# Patient Record
Sex: Male | Born: 1965 | Race: White | Hispanic: No | Marital: Single | Smoking: Never smoker
Health system: Southern US, Community
[De-identification: ages and names within clinical notes are randomized; demographics above are authoritative.]

## PROBLEM LIST (undated history)

## (undated) DIAGNOSIS — G4730 Sleep apnea, unspecified: Secondary | ICD-10-CM

## (undated) DIAGNOSIS — G709 Myoneural disorder, unspecified: Secondary | ICD-10-CM

## (undated) HISTORY — PX: TRIGGER FINGER RELEASE: SHX641

## (undated) HISTORY — PX: KNEE ARTHROSCOPY: SHX127

---
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
## 1997-04-10 HISTORY — PX: BACK SURGERY: SHX140

## 2000-04-10 HISTORY — PX: ROTATOR CUFF REPAIR: SHX139

## 2000-04-10 HISTORY — PX: ELBOW DEBRIDEMENT: SHX931

## 2002-10-28 ENCOUNTER — Ambulatory Visit (HOSPITAL_BASED_OUTPATIENT_CLINIC_OR_DEPARTMENT_OTHER): Admission: RE | Admit: 2002-10-28 | Discharge: 2002-10-28 | Payer: Self-pay | Admitting: Orthopaedic Surgery

## 2005-05-30 ENCOUNTER — Ambulatory Visit (HOSPITAL_BASED_OUTPATIENT_CLINIC_OR_DEPARTMENT_OTHER): Admission: RE | Admit: 2005-05-30 | Discharge: 2005-05-30 | Payer: Self-pay | Admitting: Orthopaedic Surgery

## 2006-04-10 HISTORY — PX: ROTATOR CUFF REPAIR: SHX139

## 2006-06-15 ENCOUNTER — Ambulatory Visit (HOSPITAL_BASED_OUTPATIENT_CLINIC_OR_DEPARTMENT_OTHER): Admission: RE | Admit: 2006-06-15 | Discharge: 2006-06-15 | Payer: Self-pay | Admitting: Orthopedic Surgery

## 2006-12-14 DIAGNOSIS — R079 Chest pain, unspecified: Secondary | ICD-10-CM | POA: Insufficient documentation

## 2009-05-24 ENCOUNTER — Encounter: Admission: RE | Admit: 2009-05-24 | Discharge: 2009-05-24 | Payer: Self-pay | Admitting: Orthopaedic Surgery

## 2010-08-26 NOTE — Op Note (Signed)
Victor [name], Victor [name]                ACCOUNT NO.:  [id number]   MEDICAL RECORD NO.:  [id number]          PATIENT TYPE:  AMB   LOCATION:  DSC                          FACILITY:  [facility]   PHYSICIAN:  Lubertha Basque. Dalldorf, M.D.DATE OF BIRTH:  [birth year]   DATE OF PROCEDURE:  05/30/2005  DATE OF DISCHARGE:                                 OPERATIVE REPORT   PREOP DIAGNOSES:  1.  Right knee chondromalacia patella.  2.  Left knee chondromalacia patella.   POSTOPERATIVE DIAGNOSES:  1.  Right knee chondromalacia patella.  2.  Left knee chondromalacia patella.   PROCEDURE:  1.  Right knee chondroplasty patellofemoral joint.  2.  Left knee chondroplasty patellofemoral joint.   ANESTHESIA:  General.   ATTENDING SURGEON:  Dalldorf.   ASSISTANT:  Carnaghi, PA.   INDICATIONS FOR PROCEDURE:  The patient is a 45 year old man with a very  long history of bilateral knee pain. He has persisted with crepitation and  effusion and pain despite aspiration and injection and oral anti-  inflammatories as well as a stretching program. He has undergone MRI scan of  one knee which shows things confined to the patellofemoral joint as causing  his trouble.  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  He is offered operative intervention to consist of bilateral arthroscopies.  Informed operative consent was obtained discussion of possible complications  of reaction to anesthesia and infection.   DESCRIPTION OF PROCEDURE:  Patient taken to the operating suite where  general anesthetic was applied without difficulty. He was positioned supine  and prepped and draped in normal sterile fashion.  After administration of  preop IV Kefzol, an arthroscopy the right knee was performed through two  inferior portals. Suprapatellar pouch was benign while the patellofemoral  joint exhibited grade 3 and 4 breakdown in the intertrochlear  groove.  Patella actually tracked fairly well. Thorough chondroplasty was done back  to stable tissues. There was minimal exposed bone and no drilling was felt  to be indicated. Also as the patella tracked in a fairly good position, we  elected not to add any lateral release. The medial and lateral compartments  exhibited no evidence of meniscal or articular cartilage injury. The knee  was thoroughly irrigated followed by placement of Marcaine with epinephrine  and morphine. Adaptic was applied followed by dry gauze and loose Ace wrap.  We then turned our tension to the left knee. An identical procedure was  performed there through two inferior portals. Again he had grade 3 and four  patellofemoral breakdown in the intertrochlear groove which was addressed  with thorough chondroplasty back to stable tissues. This side actually  looked a bit worse than the opposite side. Medial and lateral compartments  were again benign in and both knees he had intact ACL and PCL structures.  The left knee was irrigated at the end of the case followed by placement  Marcaine with epinephrine and morphine. Adaptic was placed over the portals  here followed by dry gauze and  loose Ace wrap. Estimated blood loss and  intraoperative fluids can be obtained from anesthesia records.   DISPOSITION:  The patient was extubated in the operating room and taken to  recovery room in stable addition. Plans were for him to go home the same day  and follow up in the office in less than a week. I will contact him by phone  tonight.      Lubertha Basque Jerl Santos, M.D.  Electronically Signed     PGD/MEDQ  D:  05/30/2005  T:  05/31/2005  Job:  [id number]

## 2010-08-26 NOTE — Op Note (Signed)
NAMEVIJAY, [name]                ACCOUNT NO.:  [id number]   MEDICAL RECORD NO.:  [id number]          PATIENT TYPE:  AMB   LOCATION:  DSC                          FACILITY:  [facility]   PHYSICIAN:  Harvie Junior, M.D.   DATE OF BIRTH:  04-30-1965   DATE OF PROCEDURE:  06/15/2006  DATE OF DISCHARGE:                               OPERATIVE REPORT   PREOPERATIVE DIAGNOSES:  Impingement with acromioclavicular joint  arthritis.   POSTOPERATIVE DIAGNOSES:  1. Impingement.  2. Acromioclavicular joint arthritis.  3. Superior labral tear.   PRINCIPLE PROCEDURES:  1. Anterolateral acromioplasty from the lateral and posterior      compartment.  2. Distal clavicle resection through an anterior compartment.  3. Debridement of anterior superior labral tear.   SURGEON:  Harvie Junior, M.D.   ASSISTANT:  Marshia Ly, P.A.   ANESTHESIA:  General.   BRIEF HISTORY:  Mr. Victor [name] is a 45 year old male with a long history of  having bilateral shoulder pain.  His right shoulder had been treated  several years ago, and he had done well with an acromioplasty.  He came  in complaining of pain, and he ultimately was evaluated by Dr. Jerl Santos,  who felt that he needed acromioplasty and distal clavicle resection.  He  needed to get this done with relative time urgency, and because of Dr.  Nolon Nations inability to get the surgery done in a timely fashion, he was  evaluated by [place] and taken to the operating room for this procedure.  The  patient had excellent strength preoperatively and I did not feel that he  had a rotator cuff tear, and I felt that MRI was not of much use.  He  had not had injections preoperatively, but had had the other side done  and had done well with the surgery and did not really want to go through  with injection therapy, and I thought that was not unreasonable.  He was  brought to the operating room for this procedure.   PROCEDURES:  The patient was brought to the operating room,  and after  adequate anesthesia was obtained with a general anesthetic, the patient  was placed supine on the operating table.  The left shoulder was prepped  and draped in the usual sterile fashion.  Following this, routine  arthroscopic examination of the shoulder revealed that there was an  obvious anterior superior labral tear.  The biceps tendon was well  attached, the rotator cuff undersurface was pristine.  Following this,  attention was turned out of the glenohumeral joint into the subacromial  space, and an anterolateral acromioplasty was performed from the lateral  and posterior compartment.  About 15 mm of acromion was removed and this  was smoothed down.  Attention was turned anteriorly, where a distal  clavicle resection was undertaken of about 17 mm from the anterior  compartment.  At this point, a subtotal bursectomy was performed with  the suction shaver, and the rotator cuff was evaluated thoroughly from  the superior surface.  There was no evidence of full-thickness, or even  significant partial-thickness  tearing of the rotator cuff from the  superior surface.  At this point, the shoulder was thoroughly and copiously irrigated and  suctioned dry.  The arthroscopic portals were closed with a bandage.  A  sterile compressive dressing was applied, and the patient was taken to  the recovery room, where he was noted to be in satisfactory condition.  The estimated blood loss for the procedure was negligible.      Harvie Junior, M.D.  Electronically Signed     JLG/MEDQ  D:  06/15/2006  T:  06/15/2006  Job:  [id number]

## 2010-08-26 NOTE — Op Note (Signed)
NAMENNAEMEKA, SAMSON                            ACCOUNT NO.:  [id number]   MEDICAL RECORD NO.:  [id number]                   PATIENT TYPE:  AMB   LOCATION:  DSC                                  FACILITY:  [facility]   PHYSICIAN:  Lubertha Basque. Jerl Santos, M.D.             DATE OF BIRTH:  [birth year]   DATE OF PROCEDURE:  10/28/2002  DATE OF DISCHARGE:                                 OPERATIVE REPORT   PREOPERATIVE DIAGNOSES:  1. Right shoulder impingement.  2. Right shoulder partial rotator cuff tear.  3. Right elbow lateral epicondylitis.   POSTOPERATIVE DIAGNOSES:  1. Right shoulder impingement.  2. Right shoulder partial rotator cuff tear.  3. Right elbow lateral epicondylitis.   OPERATION:  1. Right shoulder arthroscopic acromioplasty.  2. Right shoulder arthroscopic partial claviculectomy.  3. Right shoulder arthroscopic debridement partial rotator cuff tear.  4. Right elbow lateral epicondylar release.   ANESTHESIA:  General   SURGEON:  Lubertha Basque. Jerl Santos, M.D.   ASSISTANT:  Lindwood Qua, P.A.   INDICATIONS FOR PROCEDURE:  The patient is a 45 year old male with a many  year history of right dominant arm difficulty.  He is apparently status post  six or seven subacromial injections and about the same number of shots about  the outside aspect of his elbow.  He has an exam consistent with chronic  impingement and chronic lateral epicondylitis.  He has also failed an  exercise program and various oral anti-inflammatories and braces.  He has  been left with pain at rest and pain with activity.  He is offered operative  intervention at both sites.  Informed operative consent was obtained after  discussion of possible complications of, reaction to anesthesia and  infection.   DESCRIPTION OF PROCEDURE:  The patient was taken to the operating suite  where general anesthetic was applied without difficulty.  He was positioned  in beach chair position and prepped and draped in a normal  sterile fashion.  After the administration of IV antibiotic, an arthroscopy of the right  shoulder was performed through a total of two portals.  Glenohumeral joint  showed no degenerative change and the biceps and rotator cuff appear benign  from below.  In the subacromial space he had a great deal of bursitis,  addressed with a thorough bursectomy.  He had some partial thickness rotator  cuff tearing addressed with debridement, but no full thickness tear was  found.  He had a prominent subacromial morphology addressed with an  acromioplasty back to a flat surface.  This was done with the bur in the  lateral position followed by transfer of the bur to the posterior position.  He also appeared to have a prominence of the distal clavicle and partial  claviculectomy was done without a formal anterior cervical diskectomy  decompression.  This appeared to be compressed with cuff more thoroughly.  The shoulder was then irrigated followed  by reapproximation of the skin with  Nylon.  Attention was turned to the elbow.  A small lateral incision was  made with dissection down to the extensor origin.  This was split  longitudinally.  Some devitalized tissue was removed from the insertion site  on the lateral aspect of the humerus.  The wound was irrigated followed by  placement of single suture in the extensor mechanism.  Skin was  reapproximated with Nylon.  Marcaine was injected and Adaptic was applied to  the wound, followed by dry gauze dressing.  A similar dry gauze dressing was  placed on the shoulder.  Estimated blood loss and intraoperative fluids can  be obtained from anesthesia records.   DISPOSITION:  The patient was extubated in the operating room and taken to  the recovery room in stable condition.  Plans were for him to go home the  same day and follow up in the office in one week.  I will contact him by  phone tonight.                                               Lubertha Basque  Jerl Santos, M.D.    PGD/MEDQ  D:  10/28/2002  T:  10/28/2002  Job:  [id number]

## 2012-04-10 HISTORY — PX: CARPAL TUNNEL RELEASE: SHX101

## 2014-07-29 ENCOUNTER — Other Ambulatory Visit: Payer: Self-pay | Admitting: Orthopedic Surgery

## 2014-07-30 ENCOUNTER — Encounter (HOSPITAL_BASED_OUTPATIENT_CLINIC_OR_DEPARTMENT_OTHER): Payer: Self-pay | Admitting: *Deleted

## 2014-07-30 NOTE — H&P (Signed)
Victor FlightMichael Brown is an 49 y.o. male.   CC / Reason for Visit: Left long finger triggering HPI: This patient is a [age] she'll male who has had multiple hand procedures over the years, to include a left index finger trigger release and open carpal tunnel release in 2014, and more recently left long and ring finger trigger releases.  It is my understanding that on the operating room table, he was noted to have any triggering.  Within a few days following surgery he began to develop a little bit of catching that has become more pronounced since then in the long finger.  He is employed in heavy work such as Administrator, sportswelding, and has remained out of work since his surgery.  He reports that when he had surgery on the other hand for trigger releases in the more remote past, he returned to work within about 3 weeks.  Past Medical History  Diagnosis Date  . Sleep apnea     uses cpap    Past Surgical History  Procedure Laterality Date  . Back surgery  1999  . Rotator cuff repair Right 2002  . Rotator cuff repair Left 2008  . Carpal tunnel release N/A 2014  . Trigger finger release Bilateral 2001-2016    multiple  . Knee arthroplasty Bilateral 2005  . Elbow debridement Right 2002  . Mandible surgery N/A 1986    History reviewed. No pertinent family history. Social History:  reports that he has never smoked. He has never used smokeless tobacco. He reports that he does not drink alcohol or use illicit drugs.  Allergies: No Known Allergies  No prescriptions prior to admission    No results found for this or any previous visit (from the past 48 hour(s)). No results found.  Review of Systems  All other systems reviewed and are negative.   Height 6' (1.829 m), weight 131.543 kg (290 lb). Physical Exam  Constitutional:  WD, WN, NAD HEENT:  NCAT, EOMI Neuro/Psych:  Alert & oriented to person, place, and time; appropriate mood & affect Lymphatic: No generalized UE edema or lymphadenopathy Extremities / MSK:   Both UE are normal with respect to appearance, ranges of motion, joint stability, muscle strength/tone, sensation, & perfusion except as otherwise noted:  Left hand has incisions in the appropriate stage of healing to be about a month out.  They're little firm and lumpy and a little maroon.  He has tenderness at the base of the long finger just proximal to the proximal digital crease, and appears to have triggering at that site through each flexion cycle.  When it triggers, it is painful and worsened with thumb pressure applied to that site.  There is no triggering from the extensor apparatus.  Labs / Xrays:  No radiographic studies obtained today.  Assessment:  Persistent or recurrent left long finger triggering following release a month ago.  Plan: I discussed different options with him.  At this point, he can return to work until this is further resolved.  He would like to undergo exploration with additional release as indicated.  I think this is reasonable.  I indicated that the incision may wind up being longer and perhaps a 2 limb zigzag in this instance versus his original.  The details of the operative procedure were discussed with the patient.  Questions were invited and answered.  In addition to the goal of the procedure, the risks of the procedure to include but not limited to bleeding; infection; damage to the nerves or  blood vessels that could result in bleeding, numbness, weakness, chronic pain, and the need for additional procedures; stiffness; the need for revision surgery; and anesthetic risks, were reviewed.  No specific outcome was guaranteed or implied.  Informed consent was obtained.  Victor Brown A. 07/30/2014, 2:06 PM

## 2014-08-03 ENCOUNTER — Ambulatory Visit (HOSPITAL_BASED_OUTPATIENT_CLINIC_OR_DEPARTMENT_OTHER)
Admission: RE | Admit: 2014-08-03 | Discharge: 2014-08-03 | Disposition: A | Discharge status: Home / Self Care | Payer: Managed Care, Other (non HMO) | Source: Ambulatory Visit | Attending: Orthopedic Surgery | Admitting: Orthopedic Surgery

## 2014-08-03 ENCOUNTER — Ambulatory Visit (HOSPITAL_BASED_OUTPATIENT_CLINIC_OR_DEPARTMENT_OTHER): Payer: Managed Care, Other (non HMO) | Admitting: Certified Registered"

## 2014-08-03 ENCOUNTER — Encounter (HOSPITAL_BASED_OUTPATIENT_CLINIC_OR_DEPARTMENT_OTHER): Payer: Self-pay | Admitting: Certified Registered"

## 2014-08-03 ENCOUNTER — Encounter (HOSPITAL_BASED_OUTPATIENT_CLINIC_OR_DEPARTMENT_OTHER)
Admission: RE | Disposition: A | Discharge status: Home / Self Care | Payer: Self-pay | Source: Ambulatory Visit | Attending: Orthopedic Surgery

## 2014-08-03 DIAGNOSIS — Z96653 Presence of artificial knee joint, bilateral: Secondary | ICD-10-CM | POA: Insufficient documentation

## 2014-08-03 DIAGNOSIS — G4730 Sleep apnea, unspecified: Secondary | ICD-10-CM | POA: Insufficient documentation

## 2014-08-03 DIAGNOSIS — M65342 Trigger finger, left ring finger: Secondary | ICD-10-CM | POA: Diagnosis present

## 2014-08-03 DIAGNOSIS — S62109A Fracture of unspecified carpal bone, unspecified wrist, initial encounter for closed fracture: Secondary | ICD-10-CM

## 2014-08-03 HISTORY — DX: Sleep apnea, unspecified: G47.30

## 2014-08-03 HISTORY — PX: TRIGGER FINGER RELEASE: SHX641

## 2014-08-03 SURGERY — RELEASE, A1 PULLEY, FOR TRIGGER FINGER
Anesthesia: Monitor Anesthesia Care | Laterality: Left

## 2014-08-03 MED ORDER — CEFAZOLIN SODIUM-DEXTROSE 2-3 GM-% IV SOLR
2.0000 g | INTRAVENOUS | Status: AC
  Administered 2014-08-03: 3 g via INTRAVENOUS

## 2014-08-03 MED ORDER — 0.9 % SODIUM CHLORIDE (POUR BTL) OPTIME
TOPICAL | Status: DC | PRN
  Administered 2014-08-03: 200 mL

## 2014-08-03 MED ORDER — ONDANSETRON HCL 4 MG/2ML IJ SOLN
INTRAMUSCULAR | Status: DC | PRN
Start: 2014-08-03 — End: 2014-08-03
  Administered 2014-08-03: 4 mg via INTRAVENOUS

## 2014-08-03 MED ORDER — HYDROCODONE-ACETAMINOPHEN 5-325 MG PO TABS: 1.0000 | ORAL_TABLET | Freq: Four times a day (QID) | ORAL | Status: DC | PRN

## 2014-08-03 MED ORDER — BUPIVACAINE-EPINEPHRINE 0.5% -1:200000 IJ SOLN
INTRAMUSCULAR | Status: DC | PRN
  Administered 2014-08-03: 3 mL

## 2014-08-03 MED ORDER — LACTATED RINGERS IV SOLN: INTRAVENOUS | Status: DC

## 2014-08-03 MED ORDER — PROPOFOL INFUSION 10 MG/ML OPTIME
INTRAVENOUS | Status: DC | PRN
  Administered 2014-08-03: 25 ug/kg/min via INTRAVENOUS

## 2014-08-03 MED ORDER — LACTATED RINGERS IV SOLN
INTRAVENOUS | Status: DC
  Administered 2014-08-03 (×2): via INTRAVENOUS

## 2014-08-03 MED ORDER — MIDAZOLAM HCL 5 MG/5ML IJ SOLN
INTRAMUSCULAR | Status: DC | PRN
  Administered 2014-08-03: 1 mg via INTRAVENOUS

## 2014-08-03 MED ORDER — ONDANSETRON HCL 4 MG/2ML IJ SOLN: 4.0000 mg | Freq: Once | INTRAMUSCULAR | Status: DC | PRN

## 2014-08-03 MED ORDER — OXYCODONE HCL 5 MG PO TABS: 5.0000 mg | ORAL_TABLET | Freq: Once | ORAL | Status: DC | PRN

## 2014-08-03 MED ORDER — LIDOCAINE HCL 2 % IJ SOLN
INTRAMUSCULAR | Status: AC
  Filled 2014-08-03: qty 20

## 2014-08-03 MED ORDER — FENTANYL CITRATE (PF) 100 MCG/2ML IJ SOLN: 25.0000 ug | INTRAMUSCULAR | Status: DC | PRN

## 2014-08-03 MED ORDER — LIDOCAINE HCL 2 % IJ SOLN
INTRAMUSCULAR | Status: DC | PRN
Start: 2014-08-03 — End: 2014-08-03
  Administered 2014-08-03: 3 mL

## 2014-08-03 MED ORDER — OXYCODONE HCL 5 MG/5ML PO SOLN: 5.0000 mg | Freq: Once | ORAL | Status: DC | PRN

## 2014-08-03 MED ORDER — DEXMEDETOMIDINE HCL 200 MCG/2ML IV SOLN
INTRAVENOUS | Status: DC | PRN
  Administered 2014-08-03 (×12): 40 ug via INTRAVENOUS

## 2014-08-03 MED ORDER — BUPIVACAINE-EPINEPHRINE (PF) 0.5% -1:200000 IJ SOLN
INTRAMUSCULAR | Status: AC
  Filled 2014-08-03: qty 30

## 2014-08-03 MED ORDER — FENTANYL CITRATE (PF) 100 MCG/2ML IJ SOLN
INTRAMUSCULAR | Status: DC | PRN
Start: 2014-08-03 — End: 2014-08-03
  Administered 2014-08-03: 25 ug via INTRAVENOUS

## 2014-08-03 SURGICAL SUPPLY — 43 items
BLADE MINI RND TIP GREEN BEAV (BLADE) IMPLANT
BLADE SURG 15 STRL LF DISP TIS (BLADE) ×1 IMPLANT
BLADE SURG 15 STRL SS (BLADE) ×3
BNDG CMPR 9X4 STRL LF SNTH (GAUZE/BANDAGES/DRESSINGS) ×1
BNDG COHESIVE 1X5 TAN STRL LF (GAUZE/BANDAGES/DRESSINGS) ×2 IMPLANT
BNDG CONFORM 2 STRL LF (GAUZE/BANDAGES/DRESSINGS) ×3 IMPLANT
BNDG ESMARK 4X9 LF (GAUZE/BANDAGES/DRESSINGS) ×3 IMPLANT
CHLORAPREP W/TINT 26ML (MISCELLANEOUS) ×3 IMPLANT
COVER BACK TABLE 60X90IN (DRAPES) ×3 IMPLANT
COVER MAYO STAND STRL (DRAPES) ×3 IMPLANT
CUFF TOURNIQUET SINGLE 18IN (TOURNIQUET CUFF) IMPLANT
CUFF TOURNIQUET SINGLE 24IN (TOURNIQUET CUFF) ×2 IMPLANT
DRAPE EXTREMITY T 121X128X90 (DRAPE) ×3 IMPLANT
DRAPE SURG 17X23 STRL (DRAPES) ×3 IMPLANT
DRSG EMULSION OIL 3X3 NADH (GAUZE/BANDAGES/DRESSINGS) ×3 IMPLANT
GLOVE BIO SURGEON STRL SZ7.5 (GLOVE) ×3 IMPLANT
GLOVE BIOGEL PI IND STRL 7.0 (GLOVE) ×1 IMPLANT
GLOVE BIOGEL PI IND STRL 8 (GLOVE) ×1 IMPLANT
GLOVE BIOGEL PI INDICATOR 7.0 (GLOVE) ×6
GLOVE BIOGEL PI INDICATOR 8 (GLOVE) ×2
GLOVE ECLIPSE 6.5 STRL STRAW (GLOVE) ×5 IMPLANT
GOWN STRL REUS W/ TWL LRG LVL3 (GOWN DISPOSABLE) ×2 IMPLANT
GOWN STRL REUS W/TWL LRG LVL3 (GOWN DISPOSABLE) ×6
GOWN STRL REUS W/TWL XL LVL3 (GOWN DISPOSABLE) ×3 IMPLANT
NDL HYPO 25X1 1.5 SAFETY (NEEDLE) IMPLANT
NDL SAFETY ECLIPSE 18X1.5 (NEEDLE) IMPLANT
NEEDLE HYPO 18GX1.5 SHARP (NEEDLE) ×3
NEEDLE HYPO 25X1 1.5 SAFETY (NEEDLE) ×3 IMPLANT
NS IRRIG 1000ML POUR BTL (IV SOLUTION) ×3 IMPLANT
PACK BASIN DAY SURGERY FS (CUSTOM PROCEDURE TRAY) ×3 IMPLANT
PADDING CAST ABS 4INX4YD NS (CAST SUPPLIES)
PADDING CAST ABS COTTON 4X4 ST (CAST SUPPLIES) IMPLANT
PADDING CAST COTTON 2X4 NS (CAST SUPPLIES) IMPLANT
SPONGE GAUZE 4X4 12PLY STER LF (GAUZE/BANDAGES/DRESSINGS) ×3 IMPLANT
STOCKINETTE 6  STRL (DRAPES) ×2
STOCKINETTE 6 STRL (DRAPES) ×1 IMPLANT
SUT VICRYL RAPIDE 4-0 (SUTURE) IMPLANT
SUT VICRYL RAPIDE 4/0 PS 2 (SUTURE) ×2 IMPLANT
SYR BULB 3OZ (MISCELLANEOUS) ×3 IMPLANT
SYRINGE 10CC LL (SYRINGE) ×2 IMPLANT
TOWEL OR 17X24 6PK STRL BLUE (TOWEL DISPOSABLE) ×3 IMPLANT
TOWEL OR NON WOVEN STRL DISP B (DISPOSABLE) IMPLANT
UNDERPAD 30X30 INCONTINENT (UNDERPADS AND DIAPERS) ×3 IMPLANT

## 2014-08-03 NOTE — Anesthesia Procedure Notes (Signed)
Procedure Name: MAC Date/Time: 08/03/2014 9:00 AM Performed by: Mackenzye Mackel D Pre-anesthesia Checklist: Patient identified, Emergency Drugs available, Suction available, Patient being monitored and Timeout performed Patient Re-evaluated:Patient Re-evaluated prior to inductionOxygen Delivery Method: Simple face mask

## 2014-08-03 NOTE — Discharge Instructions (Addendum)
Discharge Instructions ° ° °You have a light dressing on your hand.  °You may begin gentle motion of your fingers and hand immediately, but you should not do any heavy lifting or gripping.  Elevate your hand to reduce pain & swelling of the digits.  Ice over the operative site may be helpful to reduce pain & swelling.  DO NOT USE HEAT. °Pain medicine has been prescribed for you.  °Use your medicine as needed over the first 48 hours, and then you can begin to taper your use. You may use Tylenol in place of your prescribed pain medication, but not IN ADDITION to it. °Leave the dressing in place until the third day after your surgery and then remove it, leaving it open to air.  °After the bandage has been removed you may shower, regularly washing the incision and letting the water run over it, but not submerging it (no swimming, soaking it in dishwater, etc.) °You may drive a car when you are off of prescription pain medications and can safely control your vehicle with both hands. °We will address whether therapy will be required or not when you return to the office. °You may have already made your follow-up appointment when we completed your preop visit.  If not, please call our office today or the next business day to make your return appointment for 7-10 days after surgery. ° ° °Please call [phone] during normal business hours or [phone] after hours for any problems. Including the following: ° °- excessive redness of the incisions °- drainage for more than 4 days °- fever of more than 101.5 F ° °*Please note that pain medications will not be refilled after hours or on weekends. ° ° ° °Post Anesthesia Home Care Instructions ° °Activity: °Get plenty of rest for the remainder of the day. A responsible adult should stay with you for 24 hours following the procedure.  °For the next 24 hours, DO NOT: °-Drive a car °-Operate machinery °-Drink alcoholic beverages °-Take any medication unless instructed by your  physician °-Make any legal decisions or sign important papers. ° °Meals: °Start with liquid foods such as gelatin or soup. Progress to regular foods as tolerated. Avoid greasy, spicy, heavy foods. If nausea and/or vomiting occur, drink only clear liquids until the nausea and/or vomiting subsides. Call your physician if vomiting continues. ° °Special Instructions/Symptoms: °Your throat may feel dry or sore from the anesthesia or the breathing tube placed in your throat during surgery. If this causes discomfort, gargle with warm salt water. The discomfort should disappear within 24 hours. ° °If you had a scopolamine patch placed behind your ear for the management of post- operative nausea and/or vomiting: ° °1. The medication in the patch is effective for 72 hours, after which it should be removed.  Wrap patch in a tissue and discard in the trash. Wash hands thoroughly with soap and water. °2. You may remove the patch earlier than 72 hours if you experience unpleasant side effects which may include dry mouth, dizziness or visual disturbances. °3. Avoid touching the patch. Wash your hands with soap and water after contact with the patch. °  ° °

## 2014-08-03 NOTE — Transfer of Care (Signed)
Immediate Anesthesia Transfer of Care Note  Patient: Victor FlightMichael Brown  Procedure(s) Performed: Procedure(s): LEFT LONG FINGER TRIGGER RELEASE (Left)  Patient Location: PACU  Anesthesia Type:MAC  Level of Consciousness: awake, alert , oriented and patient cooperative  Airway & Oxygen Therapy: Patient Spontanous Breathing and Patient connected to face mask oxygen  Post-op Assessment: Report given to RN and Post -op Vital signs reviewed and stable  Post vital signs: Reviewed and stable  Last Vitals:  Filed Vitals:   08/03/14 0721  BP: 125/76  Pulse: 68  Temp: 36.8 C  Resp: 18    Complications: No apparent anesthesia complications

## 2014-08-03 NOTE — Anesthesia Postprocedure Evaluation (Signed)
  Anesthesia Post-op Note  Patient: Victor GlennMichael Brown  Procedure(s) Performed: Procedure(s): LEFT LONG FINGER TRIGGER RELEASE (Left)  Patient Location: PACU  Anesthesia Type:General  Level of Consciousness: awake, alert  and oriented  Airway and Oxygen Therapy: Patient Spontanous Breathing and Patient connected to nasal cannula oxygen  Post-op Pain: mild  Post-op Assessment: Post-op Vital signs reviewed, Patient's Cardiovascular Status Stable, Respiratory Function Stable, Patent Airway and Pain level controlled  Post-op Vital Signs: stable  Last Vitals:  Filed Vitals:   08/03/14 1007  BP: 110/72  Pulse: 72  Temp: 36.7 C  Resp: 16    Complications: No apparent anesthesia complications

## 2014-08-03 NOTE — Anesthesia Preprocedure Evaluation (Addendum)
Anesthesia Evaluation  Patient identified by MRN, date of birth, ID band Patient awake    Reviewed: Allergy & Precautions, NPO status   Airway Mallampati: III       Dental  (+) Teeth Intact, Dental Advisory Given   Pulmonary sleep apnea ,  breath sounds clear to auscultation        Cardiovascular Rhythm:Regular Rate:Normal     Neuro/Psych    GI/Hepatic   Endo/Other    Renal/GU      Musculoskeletal   Abdominal   Peds  Hematology   Anesthesia Other Findings   Reproductive/Obstetrics                            Anesthesia Physical Anesthesia Plan  ASA: III  Anesthesia Plan: MAC   Post-op Pain Management:    Induction: Intravenous  Airway Management Planned: Natural Airway and Simple Face Mask  Additional Equipment:   Intra-op Plan:   Post-operative Plan:   Informed Consent: I have reviewed the patients History and Physical, chart, labs and discussed the procedure including the risks, benefits and alternatives for the proposed anesthesia with the patient or authorized representative who has indicated his/her understanding and acceptance.   Dental advisory given  Plan Discussed with: CRNA and Anesthesiologist  Anesthesia Plan Comments: (Obesity  Plan MAC)        Anesthesia Quick Evaluation

## 2014-08-03 NOTE — Op Note (Addendum)
08/03/2014  8:59 AM  PATIENT:  Victor Brown  49 y.o. male  PRE-OPERATIVE DIAGNOSIS:  Recurrent left long finger trigger digits  POST-OPERATIVE DIAGNOSIS:  Same  PROCEDURE:  Left long finger trigger release and flexor tenosynovectomy  SURGEON: Cliffton Astersavid A. Janee Mornhompson, MD  PHYSICIAN ASSISTANT: Danielle RankinKirsten Schrader, OPA-C  ANESTHESIA:  local and MAC  SPECIMENS:  None  DRAINS:   None  EBL:  less than 50 mL  PREOPERATIVE INDICATIONS:  Victor Brown is a  49 y.o. male with recurrent triggering of the left long finger following trigger finger release performed by Dr. Jerl Santosalldorf a month ago.  The risks benefits and alternatives were discussed with the patient preoperatively including but not limited to the risks of infection, bleeding, nerve injury, cardiopulmonary complications, the need for revision surgery, among others, and the patient verbalized understanding and consented to proceed.  OPERATIVE IMPLANTS: None  OPERATIVE PROCEDURE:  After receiving prophylactic antibiotics, the patient was escorted to the operative theatre and placed in a supine position.  The proposed incisions were marked and anesthetized with a mixture of lidocaine and Marcaine bearing epinephrine. I used the previous incision and extended it in a 2 limb zigzag incision over the region of the A1 pulley. He was sedated. A surgical "time-out" was performed during which the planned procedure, proposed operative site, and the correct patient identity were compared to the operative consent and agreement confirmed by the circulating nurse according to current facility policy.  Following application of a tourniquet to the operative extremity, the exposed skin was prepped with Chloraprep and draped in the usual sterile fashion.  The limb was exsanguinated with an Esmarch bandage and the tourniquet inflated to approximately 100mmHg higher than systolic BP.  The skin was incised sharply with a scalpel, subcutaneous cutaneous tissues were  dissected with blunt spreading dissection. There was a lot of dense scar obviously being one month postop in the region of the previous incision. With spreading dissection, and good retraction, ultimately the layer corresponding to the A1 pulley was identified and was split longitudinally down the midline. It appeared as if the proximal portion had been previously addressed, leaving the distal portion on addressed. In addition, any crossing bands proximal to this were also split. Care was taken to retract the neurovascular bundles radially and ulnarly in the process. There was thickened tenosynovium about both flexor tendons which was debrided with scissors and a rongeur. The patient was asked to flex and extend the digit, and could achieve full composite flexion and full extension without any catching or triggering. Tourniquet was released wound irrigated and closed with 4-0 Vicryl Rapide interrupted sutures. A light dressing was applied and he was taken to recovery room stable condition.  DISPOSITION: He'll be discharged home with typical instructions returning in 7-10 days.

## 2014-08-03 NOTE — Interval H&P Note (Signed)
History and Physical Interval Note:  08/03/2014 8:58 AM  Victor FlightMichael Brown  has presented today for surgery, with the diagnosis of LEFT LONG FINGER,RECURRENT TRIGGER FINGER  The various methods of treatment have been discussed with the patient and family. After consideration of risks, benefits and other options for treatment, the patient has consented to  Procedure(s): LEFT LONG FINGER TRIGGER RELEASE (Left) as a surgical intervention .  The patient's history has been reviewed, patient examined, no change in status, stable for surgery.  I have reviewed the patient's chart and labs.  Questions were answered to the patient's satisfaction.     Josede Cicero A.

## 2014-08-04 ENCOUNTER — Encounter (HOSPITAL_BASED_OUTPATIENT_CLINIC_OR_DEPARTMENT_OTHER): Payer: Self-pay | Admitting: Orthopedic Surgery

## 2014-11-03 ENCOUNTER — Encounter: Payer: Self-pay | Admitting: Physical Therapy

## 2014-11-03 ENCOUNTER — Ambulatory Visit (INDEPENDENT_AMBULATORY_CARE_PROVIDER_SITE_OTHER): Payer: Managed Care, Other (non HMO) | Admitting: Physical Therapy

## 2014-11-03 DIAGNOSIS — M25612 Stiffness of left shoulder, not elsewhere classified: Secondary | ICD-10-CM | POA: Diagnosis not present

## 2014-11-03 DIAGNOSIS — M25512 Pain in left shoulder: Secondary | ICD-10-CM | POA: Diagnosis not present

## 2014-11-03 DIAGNOSIS — R29898 Other symptoms and signs involving the musculoskeletal system: Secondary | ICD-10-CM

## 2014-11-03 NOTE — Patient Instructions (Signed)
Strengthening: Isometric Flexion   Using wall for resistance, press right fist into ball using light pressure. Hold __5__ seconds. Repeat _5-10___ times per set. Do _1___ sets per session. Do __2-3__ sessions per day.  Strengthening: Isometric Extension   Using wall for resistance, press back of left arm into ball using light pressure. Hold __5__ seconds. Repeat _5-10___ times per set. Do __1__ sets per session. Do _1-2___ sessions per day.   Strengthening: Isometric Abduction   Using wall for resistance, press left arm into ball using light pressure. Hold ____ seconds. Repeat ____ times per set. Do ____ sets per session. Do ____ sessions per day.   Strengthening: Isometric External Rotation   Using wall to provide resistance, and keeping right arm at side, press back of hand into ball using light pressure. Hold __5__ seconds. Repeat __5-10__ times per set. Do __1__ sets per session. Do _1-2___ sessions per day.  ROM: Flexion - Wand (Supine)   Lie on back holding wand. Raise arms over head.  Repeat _10___ times per set. Do _1-2___ sets per session. Do __1__ sessions per day.  Copyright  VHI. All rights reserved.  ROM: External Rotation - Wand (Supine)   Lie on back holding wand with elbows bent to 90. Rotate forearms over head as far as possible.  Repeat _10___ times per set. Do _1-2___ sets per session. Do __1__ sessions per day.  Copyright  VHI. All rights reserved.  Resisted External Rotation: in Neutral - Bilateral   Sit or stand, tubing in both hands, elbows at sides, bent to 90, forearms forward. Pinch shoulder blades together and rotate forearms out. Keep elbows at sides. Repeat _10___ times per set. Do _1___ sets per session. Do _1-2___ sessions per day.

## 2014-11-03 NOTE — Therapy (Signed)
[facility] [place] [address] [place] [place], [place], [address] Phone: [phone]   Fax:  [phone]  Physical Therapy Evaluation  Patient Details  Name: Victor Brown MRN: [id number] Date of Birth: 03-08-1966 Referring Provider:  Marcene Corning, MD  Encounter Date: 11/03/2014      PT End of Session - 11/03/14 0940    Visit Number 1   Number of Visits 16   Date for PT Re-Evaluation 12/29/14   PT Start Time 0805   PT Stop Time 0902   PT Time Calculation (min) 57 min   Activity Tolerance Patient tolerated treatment well      Past Medical History  Diagnosis Date  . Sleep apnea     uses cpap    Past Surgical History  Procedure Laterality Date  . Back surgery  1999  . Rotator cuff repair Right 2002  . Rotator cuff repair Left 2008  . Carpal tunnel release N/A 2014  . Trigger finger release Bilateral 2001-2016    multiple  . Knee arthroplasty Bilateral 2005  . Elbow debridement Right 2002  . Mandible surgery N/A 1986  . Trigger finger release Left 08/03/2014    Procedure: LEFT LONG FINGER TRIGGER RELEASE;  Surgeon: Mack Hook, MD;  Location: [facility];  Service: Orthopedics;  Laterality: Left;    There were no vitals filed for this visit.  Visit Diagnosis:  Pain in joint, shoulder region, left - Plan: PT plan of care cert/re-cert  Stiffness of shoulder joint, left - Plan: PT plan of care cert/re-cert  Upper limb weakness - Plan: PT plan of care cert/re-cert      Subjective Assessment - 11/03/14 0811    Subjective Pt reports last night was the worse night since surgery, had stitches out and it swelled and throbbed.    Pertinent History pt working on improving his ROM in painfree range, lifting light things ie plate and cup. mulitple surgeries on Lt hand, still has numbness in the first two fingers   Patient Stated Goals pt wishes to return to work and shooting.    Currently in Pain? No/denies   no pain at rest, with movement up to 5/10, yesterday 9/10   Multiple Pain Sites --  ice helps and rest helps            [facility] PT Assessment - 11/03/14 0001    Assessment   Medical Diagnosis Lt shoulder AC resection & debridement   Onset Date/Surgical Date 10/22/14   Hand Dominance --  ambudextrious   Next MD Visit 11/18/14   Prior Therapy not for this   Precautions   Precautions None   Balance Screen   Has the patient fallen in the past 6 months No   Has the patient had a decrease in activity level because of a fear of falling?  No   Is the patient reluctant to leave their home because of a fear of falling?  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#, push/pull   Leisure cometitive shooting, rifle/pistol, shots left handed   Observation/Other Assessments   Focus on Therapeutic Outcomes (FOTO)  61% limited   Posture/Postural Control   Posture/Postural Control Postural limitations   Postural Limitations Rounded Shoulders;Forward head  extra abdominal girth   ROM / Strength   AROM / PROM / Strength AROM;PROM;Strength   AROM   Overall AROM  --  cervical % Rt UE ROM WNL   Overall AROM Comments Lt elbow/wrist/hand WNL   AROM Assessment Site Shoulder   Right/Left Shoulder Left   Left Shoulder Extension 47 Degrees   Left Shoulder Flexion 167 Degrees   Left Shoulder ABduction 165 Degrees   Left Shoulder Internal Rotation 96 Degrees   Left Shoulder External Rotation 57 Degrees   PROM   PROM Assessment Site Shoulder   Right/Left Shoulder Left   Strength   Overall Strength --  Rt UE WNL   Overall Strength Comments Lt elbow grossly 4+/5   Strength Assessment Site Shoulder;Hand   Right/Left Shoulder Left   Right/Left hand Right;Left   Right Hand Grip (lbs) 135   Left Hand Grip (lbs) 63   Palpation   Palpation comment decreased mobility in incision top of Lt  shoulder                   [facility] Adult PT Treatment/Exercise - 11/03/14 0001    Exercises   Exercises Shoulder   Shoulder Exercises: Supine   Protraction AAROM   External Rotation 10 reps  with dowel   Flexion AAROM  with dowel   Shoulder Exercises: Standing   External Rotation Strengthening;Both;10 reps  yellow band with scap retraction   Shoulder Exercises: Isometric Strengthening   Flexion 5X5"   Extension 5X5"   External Rotation 5X5"   ABduction 5X5"   Modalities   Modalities Vasopneumatic   Vasopneumatic   Number Minutes Vasopneumatic  15 minutes   Vasopnuematic Location  Shoulder  Lt   Vasopneumatic Pressure Medium   Vasopneumatic Temperature  3*                PT Education - 11/03/14 0842    Education provided Yes   Education Details HEP   Person(s) Educated Patient   Methods Explanation;Demonstration;Handout   Comprehension Returned demonstration;Verbalized understanding          PT Short Term Goals - 11/03/14 0947    PT SHORT TERM GOAL #1   Title I with initial HEP ( 12/01/14)   Time 4   Period Weeks   Status New   PT SHORT TERM GOAL #2   Title increase Lt shoulder ER =/> 85 degrees without pain ( 12/01/14)   Time 4   Period Weeks   Status New   PT SHORT TERM GOAL #3   Title demo Lt shoulder strength =/> 4+/5 without pain ( 12/01/14)    Time 4   Period Weeks   Status New   PT SHORT TERM GOAL #4   Title improve FOTO =/< 45% limited ( 12/01/14)   Time 4   Period Weeks   Status New           PT Long Term Goals - 11/03/14 1610    PT LONG TERM GOAL #1   Title I with advanced HEP ( 12/29/14)    Time 8   Period Weeks   Status New   PT LONG TERM GOAL #2   Title demo Lt Ue strength =/> 5/5 ( 12/29/14)    Time 8   Period Weeks   Status New   PT LONG TERM GOAL #3   Title improve Lt grip =/> 100# ( 12/29/14)    Time 8   Period Weeks   Status New   PT LONG TERM GOAL #4   Title improve FOTO =/< 32% limited 9 12/29/14)     Time 8   Period Weeks  Status New   PT LONG TERM GOAL #5   Title if cleared by MD lift =/> 50# floor to chest level ( 12/29/14)   Time 8   Period Weeks   Status New               Plan - 11/03/14 9562    Clinical Impression Statement Pt is s/p Lt shoulder decompression 10 days ago.  He is doing very well with ROM in his shoulder.  He is having some pain and has functional weakness.  He has a physical job and is lifting/pushing/pulling  all day, up to 70#.  He also has decreased scar mobility in the incision on top of his shoulder   Pt will benefit from skilled therapeutic intervention in order to improve on the following deficits Obesity;Impaired UE functional use;Pain;Decreased range of motion;Decreased strength   Rehab Potential Excellent   PT Frequency 2x / week   PT Duration 8 weeks   PT Treatment/Interventions Moist Heat;Ultrasound;Therapeutic exercise;Taping;Manual techniques;Neuromuscular re-education;Cryotherapy;Electrical Stimulation;Patient/family education;Scar mobilization;Passive range of motion   PT Next Visit Plan porgess scapular stab , wrip strength   Consulted and Agree with Plan of Care Patient         Problem List There are no active problems to display for this patient.   Roderic Scarce PT 11/03/2014, 9:53 AM  [facility] [address] [place] [address] [place], [place], [address] Phone: [phone]   Fax:  [phone]

## 2014-11-05 ENCOUNTER — Ambulatory Visit (INDEPENDENT_AMBULATORY_CARE_PROVIDER_SITE_OTHER): Payer: Managed Care, Other (non HMO) | Admitting: Physical Therapy

## 2014-11-05 DIAGNOSIS — M25612 Stiffness of left shoulder, not elsewhere classified: Secondary | ICD-10-CM

## 2014-11-05 DIAGNOSIS — R29898 Other symptoms and signs involving the musculoskeletal system: Secondary | ICD-10-CM | POA: Diagnosis not present

## 2014-11-05 DIAGNOSIS — M25512 Pain in left shoulder: Secondary | ICD-10-CM

## 2014-11-05 NOTE — Therapy (Signed)
[facility] [place] [address] [place], [place], [address] Phone: [phone]   Fax:  [phone]  Physical Therapy Treatment  Patient Details  Name: Victor Brown MRN: [id number] Date of Birth: 1966/02/25 Referring Provider:  Marcene Corning, MD  Encounter Date: 11/05/2014      PT End of Session - 11/05/14 1457    Visit Number 2   Number of Visits 16   Date for PT Re-Evaluation 12/29/14   PT Start Time 1445   PT Stop Time 1543   PT Time Calculation (min) 58 min   Activity Tolerance Patient tolerated treatment well      Past Medical History  Diagnosis Date  . Sleep apnea     uses cpap    Past Surgical History  Procedure Laterality Date  . Back surgery  1999  . Rotator cuff repair Right 2002  . Rotator cuff repair Left 2008  . Carpal tunnel release N/A 2014  . Trigger finger release Bilateral 2001-2016    multiple  . Knee arthroplasty Bilateral 2005  . Elbow debridement Right 2002  . Mandible surgery N/A 1986  . Trigger finger release Left 08/03/2014    Procedure: LEFT LONG FINGER TRIGGER RELEASE;  Surgeon: Mack Hook, MD;  Location: [facility];  Service: Orthopedics;  Laterality: Left;    There were no vitals filed for this visit.  Visit Diagnosis:  Pain in joint, shoulder region, left  Stiffness of shoulder joint, left  Upper limb weakness      Subjective Assessment - 11/05/14 1541    Subjective Pt with no new complaints.  Lt shoulder remains sore, especially incision area.    Currently in Pain? Yes   Pain Score 3    Pain Location Shoulder   Pain Orientation Left   Pain Descriptors / Indicators Sore;Tender   Aggravating Factors  moving arm in abduction, full flexion   Pain Relieving Factors arm at rest, ice            [facility] PT Assessment - 11/05/14 0001    Assessment   Medical Diagnosis Lt shoulder AC resection & debridement   Onset Date/Surgical Date 10/22/14   Next MD  Visit 11/18/14                     [facility] Adult PT Treatment/Exercise - 11/05/14 0001    Shoulder Exercises: Prone   Other Prone Exercises prone row (no weight) x 10 reps with scap retraction    Shoulder Exercises: Sidelying   External Rotation AROM;Left;10 reps  2 sets   Shoulder Exercises: Standing   Other Standing Exercises Wall ladder in Lt abd (up to #32) x 8 reps   Shoulder Exercises: Pulleys   Flexion 3 minutes   ABduction 2 minutes   Shoulder Exercises: Isometric Strengthening   Flexion --  5 sec x 10 reps   Extension --  5 sec x 10 reps    External Rotation --  5 reps x 10   Internal Rotation --  5 sec x 10 reps    ABduction --  5 sec x 10 reps   Modalities   Modalities Vasopneumatic;Electrical Stimulation   Electrical Stimulation   Electrical Stimulation Location Lt shoulder    Electrical Stimulation Action IFC   Electrical Stimulation Parameters to tolerance, x 15 min    Electrical Stimulation Goals Pain   Vasopneumatic   Number Minutes Vasopneumatic  15 minutes   Vasopnuematic Location  Shoulder  Lt  Vasopneumatic Pressure Medium   Vasopneumatic Temperature  3*                  PT Short Term Goals - 11/03/14 0947    PT SHORT TERM GOAL #1   Title I with initial HEP ( 12/01/14)   Time 4   Period Weeks   Status New   PT SHORT TERM GOAL #2   Title increase Lt shoulder ER =/> 85 degrees without pain ( 12/01/14)   Time 4   Period Weeks   Status New   PT SHORT TERM GOAL #3   Title demo Lt shoulder strength =/> 4+/5 without pain ( 12/01/14)    Time 4   Period Weeks   Status New   PT SHORT TERM GOAL #4   Title improve FOTO =/< 45% limited ( 12/01/14)   Time 4   Period Weeks   Status New           PT Long Term Goals - 11/03/14 1610    PT LONG TERM GOAL #1   Title I with advanced HEP ( 12/29/14)    Time 8   Period Weeks   Status New   PT LONG TERM GOAL #2   Title demo Lt Ue strength =/> 5/5 ( 12/29/14)    Time 8   Period  Weeks   Status New   PT LONG TERM GOAL #3   Title improve Lt grip =/> 100# ( 12/29/14)    Time 8   Period Weeks   Status New   PT LONG TERM GOAL #4   Title improve FOTO =/< 32% limited 9 12/29/14)    Time 8   Period Weeks   Status New   PT LONG TERM GOAL #5   Title if cleared by MD lift =/> 50# floor to chest level ( 12/29/14)   Time 8   Period Weeks   Status New               Plan - 11/05/14 1537    Clinical Impression Statement Pt tolerated most exercises with minor increase in pain in Lt shoulder; demostrated difficulty tolerating prone position for prone row (no wt). Pt reported decreased pain in Lt shoulder after use of vaso and estim.    Pt will benefit from skilled therapeutic intervention in order to improve on the following deficits Obesity;Impaired UE functional use;Pain;Decreased range of motion;Decreased strength   Rehab Potential Excellent   PT Frequency 2x / week   PT Duration 8 weeks   PT Treatment/Interventions Moist Heat;Ultrasound;Therapeutic exercise;Taping;Manual techniques;Neuromuscular re-education;Cryotherapy;Electrical Stimulation;Patient/family education;Scar mobilization;Passive range of motion   PT Next Visit Plan Progress scapular stabilization.    Consulted and Agree with Plan of Care Patient        Problem List There are no active problems to display for this patient.   Mayer Camel, PTA 11/05/2014 3:54 PM  [facility] [place] [address] [place] [address] [place], [place], [address] Phone: [phone]   Fax:  [phone]

## 2014-11-10 ENCOUNTER — Ambulatory Visit (INDEPENDENT_AMBULATORY_CARE_PROVIDER_SITE_OTHER): Payer: Managed Care, Other (non HMO) | Admitting: Rehabilitative and Restorative Service Providers"

## 2014-11-10 ENCOUNTER — Encounter: Payer: Self-pay | Admitting: Rehabilitative and Restorative Service Providers"

## 2014-11-10 DIAGNOSIS — R29898 Other symptoms and signs involving the musculoskeletal system: Secondary | ICD-10-CM

## 2014-11-10 DIAGNOSIS — M25512 Pain in left shoulder: Secondary | ICD-10-CM

## 2014-11-10 DIAGNOSIS — M25612 Stiffness of left shoulder, not elsewhere classified: Secondary | ICD-10-CM

## 2014-11-10 NOTE — Patient Instructions (Signed)
Shoulder Blade Squeeze   Rotate shoulders back, then squeeze shoulder blades down and back. Repeat _10___ times. Do __several__ sessions per day.      Scapula Adduction With Pectorals, Low   Stand in doorframe with palms against frame and arms at 45. Lean forward and squeeze shoulder blades. Hold _20__ seconds. Repeat __3_ times per session. Do __2-3_ sessions per day.  .    Scapula Adduction With Pectorals, Mid-Range   Stand in doorframe with palms against frame and arms at 90. Lean forward and squeeze shoulder blades. Hold _20__ seconds. Repeat __3_ times per session. Do __2-3_ sessions per day.   \Scapula Adduction With Pectorals, High   Stand in doorframe with palms against frame and arms at 120. Lean forward and squeeze shoulder blades. Hold _20__ seconds. Repeat _3__ times per session. Do _2-3__ sessions per day.  Resisted External Rotation: in Neutral - Bilateral   PALMS UP (can keep thumbs up if it bothers elbow) Sit or stand, tubing in both hands, elbows at sides, bent to 90, forearms forward. Pinch shoulder blades together and rotate forearms out. Keep elbows at sides. Repeat __10__ times per set. Do _2-3___ sets per session. Do _2-3___ sessions per day.   Low Row: Standing   Face anchor, feet shoulder width apart. Palms up, pull arms back, squeezing shoulder blades together. Repeat 10__ times per set. Do 2-3__ sets per session. Do 2-3__ sessions per week. Anchor Height: Waist     Strengthening: Resisted Extension   Hold tubing in right hand, arm forward. Pull arm back, elbow straight. Repeat _10___ times per set. Do 2-3____ sets per session. Do 2-3____ sessions per day.

## 2014-11-10 NOTE — Therapy (Signed)
[facility] [place] [address] [place], [place], [address] Phone: [phone]   Fax:  [phone]  Physical Therapy Treatment  Patient Details  Name: Victor Brown MRN: [id number] Date of Birth: 1965-06-22 Referring Provider:  Marcene Corning, MD  Encounter Date: 11/10/2014      PT End of Session - 11/10/14 1153    Visit Number 3   Number of Visits 16   Date for PT Re-Evaluation 12/29/14   PT Start Time 1103   PT Stop Time 1155   PT Time Calculation (min) 52 min   Activity Tolerance Patient tolerated treatment well      Past Medical History  Diagnosis Date  . Sleep apnea     uses cpap    Past Surgical History  Procedure Laterality Date  . Back surgery  1999  . Rotator cuff repair Right 2002  . Rotator cuff repair Left 2008  . Carpal tunnel release N/A 2014  . Trigger finger release Bilateral 2001-2016    multiple  . Knee arthroplasty Bilateral 2005  . Elbow debridement Right 2002  . Mandible surgery N/A 1986  . Trigger finger release Left 08/03/2014    Procedure: LEFT LONG FINGER TRIGGER RELEASE;  Surgeon: Mack Hook, MD;  Location: [facility];  Service: Orthopedics;  Laterality: Left;    There were no vitals filed for this visit.  Visit Diagnosis:  Pain in joint, shoulder region, left  Stiffness of shoulder joint, left  Upper limb weakness      Subjective Assessment - 11/10/14 1103    Subjective Shoulder remains sore - still feels weak. Exercises going well at home.    Currently in Pain? No/denies           [facility] Adult PT Treatment/Exercise - 11/10/14 0001    Shoulder Exercises: Supine   Other Supine Exercises lying with UE's abducted 3-5 min   Shoulder Exercises: Sidelying   Other Sidelying Exercises closed chain ball on wall shoulder height elbow    Shoulder Exercises: Standing   External Rotation Strengthening;Both;10 reps   Theraband Level (Shoulder External Rotation) Level  1 (Yellow)  10 reps 2 sets   Extension Strengthening;Both;20 reps   Theraband Level (Shoulder Extension) Level 2 (Red)   Row Strengthening;Both;20 reps   Theraband Level (Shoulder Row) Level 2 (Red)   Other Standing Exercises Wall push up 10 reps 2 sets   Shoulder Exercises: Pulleys   Flexion Limitations 10 sec hold 10 reps   ABduction Limitations 10 sec hold 10 reps   Shoulder Exercises: Therapy Ball   Flexion Limitations stepping under ther ball for stretch 3 reps 20 sec hold   Other Therapy Ball Exercises closed chain ball on wall circles CW/CCW 1 min 3 reps   Cryotherapy   Number Minutes Cryotherapy 15 Minutes   Cryotherapy Location Shoulder   Type of Cryotherapy Ice pack   Manual Therapy   Manual therapy comments deep tissue release/TP work through anterior Lt shoulder patient supine             PT Education - 11/10/14 1152    Education provided Yes   Education Details postural correction/education; improtance of position of shoulder blade for shoulder function; HEP   Person(s) Educated Patient   Methods Explanation;Demonstration;Tactile cues;Verbal cues;Handout   Comprehension Verbalized understanding;Returned demonstration;Verbal cues required          PT Short Term Goals - 11/10/14 1157    PT SHORT TERM GOAL #1   Title I with  initial HEP ( 12/01/14)   Time 4   Period Weeks   Status On-going   PT SHORT TERM GOAL #2   Title increase Lt shoulder ER =/> 85 degrees without pain ( 12/01/14)   Time 4   Period Weeks   Status On-going   PT SHORT TERM GOAL #3   Title demo Lt shoulder strength =/> 4+/5 without pain ( 12/01/14)    Time 4   Period Weeks   Status On-going   PT SHORT TERM GOAL #4   Title improve FOTO =/< 45% limited ( 12/01/14)   Time 4   Period Weeks   Status On-going           PT Long Term Goals - 11/10/14 1157    PT LONG TERM GOAL #1   Title I with advanced HEP ( 12/29/14)    Time 8   Period Weeks   Status On-going   PT LONG TERM GOAL #2    Title demo Lt Ue strength =/> 5/5 ( 12/29/14)    Time 8   Period Weeks   Status On-going   PT LONG TERM GOAL #3   Title improve Lt grip =/> 100# ( 12/29/14)    Time 8   Period Weeks   Status On-going   PT LONG TERM GOAL #4   Title improve FOTO =/< 32% limited 9 12/29/14)    Time 8   Period Weeks   Status On-going   PT LONG TERM GOAL #5   Title if cleared by MD lift =/> 50# floor to chest level ( 12/29/14)   Time 8   Period Weeks   Status On-going               Plan - 11/10/14 1154    Clinical Impression Statement Some continued soreness; tolerated exercies with minimal discomfort; progressed with end range stretching and posterior shoulder girdle strengthening. Requires continued treatment to accomplish goals of therapy.   Pt will benefit from skilled therapeutic intervention in order to improve on the following deficits Obesity;Impaired UE functional use;Pain;Decreased range of motion;Decreased strength   Rehab Potential Excellent   PT Frequency 2x / week   PT Duration 8 weeks   PT Treatment/Interventions Moist Heat;Ultrasound;Therapeutic exercise;Taping;Manual techniques;Neuromuscular re-education;Cryotherapy;Electrical Stimulation;Patient/family education;Scar mobilization;Passive range of motion   Consulted and Agree with Plan of Care Patient        Problem List There are no active problems to display for this patient.   Val Riles, PT, MPH 11/10/2014, 11:58 AM  [facility] [address] [place], [place], [address] Phone: [phone]   Fax:  [phone]

## 2014-11-12 ENCOUNTER — Ambulatory Visit (INDEPENDENT_AMBULATORY_CARE_PROVIDER_SITE_OTHER): Payer: Managed Care, Other (non HMO) | Admitting: Physical Therapy

## 2014-11-12 DIAGNOSIS — R29898 Other symptoms and signs involving the musculoskeletal system: Secondary | ICD-10-CM

## 2014-11-12 DIAGNOSIS — M25512 Pain in left shoulder: Secondary | ICD-10-CM

## 2014-11-12 DIAGNOSIS — M25612 Stiffness of left shoulder, not elsewhere classified: Secondary | ICD-10-CM

## 2014-11-12 NOTE — Therapy (Signed)
[facility] [place] [address] [place], [place], [address] Phone: [phone]   Fax:  [phone]  Physical Therapy Treatment  Patient Details  Name: Victor Brown MRN: [id number] Date of Birth: 02-15-1966 Referring Provider:  Marcene Corning, MD  Encounter Date: 11/12/2014      PT End of Session - 11/12/14 1457    Visit Number 4   Number of Visits 16   Date for PT Re-Evaluation 12/29/14   PT Start Time 1450   PT Stop Time 1550   PT Time Calculation (min) 60 min   Activity Tolerance Patient tolerated treatment well      Past Medical History  Diagnosis Date  . Sleep apnea     uses cpap    Past Surgical History  Procedure Laterality Date  . Back surgery  1999  . Rotator cuff repair Right 2002  . Rotator cuff repair Left 2008  . Carpal tunnel release N/A 2014  . Trigger finger release Bilateral 2001-2016    multiple  . Knee arthroplasty Bilateral 2005  . Elbow debridement Right 2002  . Mandible surgery N/A 1986  . Trigger finger release Left 08/03/2014    Procedure: LEFT LONG FINGER TRIGGER RELEASE;  Surgeon: Mack Hook, MD;  Location: [facility];  Service: Orthopedics;  Laterality: Left;    There were no vitals filed for this visit.  Visit Diagnosis:  Pain in joint, shoulder region, left  Stiffness of shoulder joint, left  Upper limb weakness      Subjective Assessment - 11/12/14 1458    Subjective Pt reports Lt abd, and abd with ER painful (AROM).    Currently in Pain? Yes   Pain Score 3    Pain Location Shoulder   Pain Orientation Left   Pain Descriptors / Indicators Sore;Tender;Burning   Aggravating Factors  moving arm in abduction, full flexion    Pain Relieving Factors arm at rest, ice             [facility] PT Assessment - 11/12/14 0001    Assessment   Medical Diagnosis Lt shoulder AC resection & debridement   Onset Date/Surgical Date 10/22/14   Hand Dominance Left   Next MD  Visit 11/18/14   AROM   AROM Assessment Site Shoulder   Right/Left Shoulder Left   Left Shoulder External Rotation 78 Degrees  supine, 90 abd, after ER stretch   Strength   Right/Left hand Right;Left   Right Hand Grip (lbs) 140   Left Hand Grip (lbs) 90   Palpation   Palpation comment decreased mobility in incision top of Lt shoulder                     [facility] Adult PT Treatment/Exercise - 11/12/14 0001    Exercises   Exercises Hand;Shoulder   Shoulder Exercises: Standing   External Rotation Strengthening;Both;Theraband  3 sets of 10    Theraband Level (Shoulder External Rotation) Level 1 (Yellow)   Row Strengthening;Both;10 reps;Theraband   Theraband Level (Shoulder Row) Level 1 (Yellow)  2 sets    Other Standing Exercises Scap squeeze against noodle x 5 sec hold x 10 reps    Other Standing Exercises wall push ups x 10 reps    Shoulder Exercises: Pulleys   Flexion Limitations 10 sec hold 10 reps   ABduction Limitations 10 sec hold 10 reps   Shoulder Exercises: ROM/Strengthening   Rhythmic Stabilization, Supine Lt shoulder flex 90 deg x 30 sec, x 2  reps, @ 45 deg flexion x 30 sec x 1 rep.   Shoulder Exercises: Stretch   External Rotation Stretch --  with 1# wt, long duration ( ).   Other Shoulder Stretches Modified doorway stretch to mid-level with Lt arm only x 30 sec x 2 reps ; demo and tactile cues for form.  Low doorway stretch with elbows straight x 30 sec x 2 (BUE)   Hand Exercises   Other Hand Exercises Large hand clamp grip exercise, eccentric control into extension x 10 reps x 2 sets    Modalities   Modalities Electrical Stimulation;Vasopneumatic   Electrical Stimulation   Electrical Stimulation Location Lt shoulder    Electrical Stimulation Action IFC   Electrical Stimulation Parameters to tolerance x 15 min    Electrical Stimulation Goals Pain   Vasopneumatic   Number Minutes Vasopneumatic  15 minutes   Vasopnuematic Location  Shoulder    Vasopneumatic Pressure Medium   Vasopneumatic Temperature  3*                PT Education - 11/12/14 1538    Education provided Yes   Education Details Pt instructed to back down to yellow band or isometrics for HEP depending on how he tolerated todays session.  Modified doorway stretch to reduce pain.    Person(s) Educated Patient   Methods Explanation;Demonstration  pt declined handout   Comprehension Verbalized understanding;Returned demonstration          PT Short Term Goals - 11/10/14 1157    PT SHORT TERM GOAL #1   Title I with initial HEP ( 12/01/14)   Time 4   Period Weeks   Status On-going   PT SHORT TERM GOAL #2   Title increase Lt shoulder ER =/> 85 degrees without pain ( 12/01/14)   Time 4   Period Weeks   Status On-going   PT SHORT TERM GOAL #3   Title demo Lt shoulder strength =/> 4+/5 without pain ( 12/01/14)    Time 4   Period Weeks   Status On-going   PT SHORT TERM GOAL #4   Title improve FOTO =/< 45% limited ( 12/01/14)   Time 4   Period Weeks   Status On-going           PT Long Term Goals - 11/10/14 1157    PT LONG TERM GOAL #1   Title I with advanced HEP ( 12/29/14)    Time 8   Period Weeks   Status On-going   PT LONG TERM GOAL #2   Title demo Lt Ue strength =/> 5/5 ( 12/29/14)    Time 8   Period Weeks   Status On-going   PT LONG TERM GOAL #3   Title improve Lt grip =/> 100# ( 12/29/14)    Time 8   Period Weeks   Status On-going   PT LONG TERM GOAL #4   Title improve FOTO =/< 32% limited 9 12/29/14)    Time 8   Period Weeks   Status On-going   PT LONG TERM GOAL #5   Title if cleared by MD lift =/> 50# floor to chest level ( 12/29/14)   Time 8   Period Weeks   Status On-going               Plan - 11/12/14 1604    Clinical Impression Statement Pt demo gains in Lt grip strength and Lt shoulder ER, though still limited.  Pt reported shoulder pain with red  band exercises and doorway stretch for HEP; modified to pt using  yellow band and modified form for door way stretch.  Progressing well towards goals.    Pt will benefit from skilled therapeutic intervention in order to improve on the following deficits Obesity;Impaired UE functional use;Pain;Decreased range of motion;Decreased strength   Rehab Potential Excellent   PT Frequency 2x / week   PT Duration 8 weeks   PT Treatment/Interventions Moist Heat;Ultrasound;Therapeutic exercise;Taping;Manual techniques;Neuromuscular re-education;Cryotherapy;Electrical Stimulation;Patient/family education;Scar mobilization;Passive range of motion   PT Next Visit Plan Progress scapular stabilization. Write MD note; appointment following day.    Consulted and Agree with Plan of Care Patient        Problem List There are no active problems to display for this patient.   Mayer Camel, PTA 11/12/2014 4:09 PM  [facility] [place] [address] [place], [place], [address] Phone: [phone]   Fax:  [phone]

## 2014-11-17 ENCOUNTER — Ambulatory Visit (INDEPENDENT_AMBULATORY_CARE_PROVIDER_SITE_OTHER): Payer: Managed Care, Other (non HMO) | Admitting: Physical Therapy

## 2014-11-17 DIAGNOSIS — M25512 Pain in left shoulder: Secondary | ICD-10-CM | POA: Diagnosis not present

## 2014-11-17 DIAGNOSIS — R29898 Other symptoms and signs involving the musculoskeletal system: Secondary | ICD-10-CM

## 2014-11-17 DIAGNOSIS — M25612 Stiffness of left shoulder, not elsewhere classified: Secondary | ICD-10-CM

## 2014-11-17 NOTE — Therapy (Signed)
[facility] [place] [address] [place], [place], [address] Phone: [phone]   Fax:  [phone]  Physical Therapy Treatment  Patient Details  Name: Victor Brown MRN: [id number] Date of Birth: June 19, 1965 Referring Provider:  Marcene Corning, MD  Encounter Date: 11/17/2014      PT End of Session - 11/17/14 1106    Visit Number 5   Number of Visits 16   Date for PT Re-Evaluation 12/29/14   PT Start Time 1103   PT Stop Time 1200   PT Time Calculation (min) 57 min   Activity Tolerance Patient tolerated treatment well      Past Medical History  Diagnosis Date  . Sleep apnea     uses cpap    Past Surgical History  Procedure Laterality Date  . Back surgery  1999  . Rotator cuff repair Right 2002  . Rotator cuff repair Left 2008  . Carpal tunnel release N/A 2014  . Trigger finger release Bilateral 2001-2016    multiple  . Knee arthroplasty Bilateral 2005  . Elbow debridement Right 2002  . Mandible surgery N/A 1986  . Trigger finger release Left 08/03/2014    Procedure: LEFT LONG FINGER TRIGGER RELEASE;  Surgeon: Mack Hook, MD;  Location: [facility];  Service: Orthopedics;  Laterality: Left;    There were no vitals filed for this visit.  Visit Diagnosis:  Pain in joint, shoulder region, left  Stiffness of shoulder joint, left  Upper limb weakness      Subjective Assessment - 11/17/14 1107    Subjective Pt reports he is still sore, but backing off resistance exercises has helped decrease pain in Lt shoulder.     Currently in Pain? Yes   Pain Score 3    Pain Location Shoulder   Pain Orientation Left   Pain Descriptors / Indicators Dull;Sore;Tender   Aggravating Factors  moving arm into abduction, full flexion    Pain Relieving Factors arm at rest, ice.             [facility] PT Assessment - 11/17/14 0001    Assessment   Medical Diagnosis Lt shoulder AC resection & debridement   Onset  Date/Surgical Date 10/22/14   Next MD Visit 11/18/14   AROM   AROM Assessment Site Shoulder   Right/Left Shoulder Left   Left Shoulder Flexion 168 Degrees  supine,  160 deg standing    Left Shoulder ABduction 167 Degrees  supine, 160 deg standing   Left Shoulder Internal Rotation 70 Degrees   Left Shoulder External Rotation 79 Degrees  supine, abd 90 deg.    Strength   Right/Left hand Right;Left   Right Hand Grip (lbs) 120   Left Hand Grip (lbs) 95                     [facility] Adult PT Treatment/Exercise - 11/17/14 0001    Shoulder Exercises: Standing   External Rotation Strengthening;Both;Theraband  2 sets of 10, with scaps squeezing noodle.    Theraband Level (Shoulder External Rotation) Level 1 (Yellow)   Extension Strengthening;Both;10 reps   Theraband Level (Shoulder Extension) Level 1 (Yellow)   Row Both;10 reps;Theraband   Theraband Level (Shoulder Row) Level 1 (Yellow)  2 sets   Other Standing Exercises Rings on arch moving Lt to/from Rt: 2 trials (flexion up to 140 deg)    Shoulder Exercises: Pulleys   Flexion Limitations 10 sec hold 10 reps   Shoulder Exercises: Therapy [place]  Other Therapy Ball Exercises closed chain ball on wall circles CW/CCW 1 min 3 reps   Shoulder Exercises: ROM/Strengthening   UBE (Upper Arm Bike) UBE: L1 - 1 min each direction, x 2 reps    Modalities   Modalities Electrical Stimulation;Vasopneumatic   Electrical Stimulation   Electrical Stimulation Location Lt shoulder    Electrical Stimulation Action IFC   Electrical Stimulation Parameters to tolerance x 15 min    Electrical Stimulation Goals Pain   Vasopneumatic   Number Minutes Vasopneumatic  15 minutes   Vasopnuematic Location  Shoulder   Vasopneumatic Pressure Medium   Vasopneumatic Temperature  3*                  PT Short Term Goals - 11/10/14 1157    PT SHORT TERM GOAL #1   Title I with initial HEP ( 12/01/14)   Time 4   Period Weeks   Status On-going    PT SHORT TERM GOAL #2   Title increase Lt shoulder ER =/> 85 degrees without pain ( 12/01/14)   Time 4   Period Weeks   Status On-going   PT SHORT TERM GOAL #3   Title demo Lt shoulder strength =/> 4+/5 without pain ( 12/01/14)    Time 4   Period Weeks   Status On-going   PT SHORT TERM GOAL #4   Title improve FOTO =/< 45% limited ( 12/01/14)   Time 4   Period Weeks   Status On-going           PT Long Term Goals - 11/10/14 1157    PT LONG TERM GOAL #1   Title I with advanced HEP ( 12/29/14)    Time 8   Period Weeks   Status On-going   PT LONG TERM GOAL #2   Title demo Lt Ue strength =/> 5/5 ( 12/29/14)    Time 8   Period Weeks   Status On-going   PT LONG TERM GOAL #3   Title improve Lt grip =/> 100# ( 12/29/14)    Time 8   Period Weeks   Status On-going   PT LONG TERM GOAL #4   Title improve FOTO =/< 32% limited 9 12/29/14)    Time 8   Period Weeks   Status On-going   PT LONG TERM GOAL #5   Title if cleared by MD lift =/> 50# floor to chest level ( 12/29/14)   Time 8   Period Weeks   Status On-going               Plan - 11/17/14 1150    Clinical Impression Statement Pt demo gains in Lt grip strength and Lt shoulder ROM from initial visit.  Pt tolerated all new exercises today with minimal increase in pain. Pt remains motivated to progress towards goals. Making good gains towards goals. Pt will benefit from continued PT intervention to maximize UE function.    Pt will benefit from skilled therapeutic intervention in order to improve on the following deficits Obesity;Impaired UE functional use;Pain;Decreased range of motion;Decreased strength   Rehab Potential Excellent   PT Frequency 2x / week   PT Duration 8 weeks   PT Treatment/Interventions Moist Heat;Ultrasound;Therapeutic exercise;Taping;Manual techniques;Neuromuscular re-education;Cryotherapy;Electrical Stimulation;Patient/family education;Scar mobilization;Passive range of motion   PT Next Visit Plan  Progress shoulder strengthening and scapular stabilization per protocol.    Consulted and Agree with Plan of Care Patient        Problem List There are no  active problems to display for this patient.   Mayer Camel, PTA 11/17/2014 1:04 PM  [facility] [place] [address] [place] [place], [place], [address] Phone: [phone]   Fax:  [phone]

## 2014-11-19 ENCOUNTER — Encounter: Payer: Self-pay | Admitting: Rehabilitative and Restorative Service Providers"

## 2014-11-19 ENCOUNTER — Ambulatory Visit (INDEPENDENT_AMBULATORY_CARE_PROVIDER_SITE_OTHER): Payer: Managed Care, Other (non HMO) | Admitting: Rehabilitative and Restorative Service Providers"

## 2014-11-19 DIAGNOSIS — R29898 Other symptoms and signs involving the musculoskeletal system: Secondary | ICD-10-CM

## 2014-11-19 DIAGNOSIS — M25512 Pain in left shoulder: Secondary | ICD-10-CM

## 2014-11-19 DIAGNOSIS — M25612 Stiffness of left shoulder, not elsewhere classified: Secondary | ICD-10-CM | POA: Diagnosis not present

## 2014-11-19 NOTE — Patient Instructions (Signed)
Flexibility: Corner Stretch   Standing in corner with hands at  shoulder level and feet __several__ inches from corner, lean forward until a comfortable stretch is felt across chest. Hold __30__ seconds. Repeat _3___ times per set. Do _4-5___ sessions per day.   Myofacial ball release work through the pecs facing the wall - - ~ 4 inch rubber ball.    External Rotator Cuff Stretch, Supine (Passive)   Lie supine, left elbow at 90 degrees from body, and elbow bent at 90, dowel in palm, other hand holding dowel up. Use other arm to push forearm toward floor. Gradually bring elbow in closer to side. Hold _20_ seconds. In each position.Repeat _3_ times per session. Do 2-3___ sessions per day.  Lying on back with arm at side shoulder 90 degrees from body, elbow at 90 degrees resting on bed press arm back into bed hold 10 sec 10 reps
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
## 2014-11-24 ENCOUNTER — Ambulatory Visit (INDEPENDENT_AMBULATORY_CARE_PROVIDER_SITE_OTHER): Payer: Managed Care, Other (non HMO) | Admitting: Physical Therapy

## 2014-11-24 ENCOUNTER — Encounter: Payer: Self-pay | Admitting: Physical Therapy

## 2014-11-24 DIAGNOSIS — M25512 Pain in left shoulder: Secondary | ICD-10-CM

## 2014-11-24 DIAGNOSIS — M25612 Stiffness of left shoulder, not elsewhere classified: Secondary | ICD-10-CM | POA: Diagnosis not present

## 2014-11-24 DIAGNOSIS — R29898 Other symptoms and signs involving the musculoskeletal system: Secondary | ICD-10-CM

## 2014-11-24 NOTE — Therapy (Signed)
[facility] [place] [address] [place] [place] [address] [place], [place], [address] Phone: [phone]   Fax:  [phone]  Physical Therapy Treatment  Patient Details  Name: Victor Brown MRN: [id number] Date of Birth: 01/12/1966 Referring Provider:  Marcene Corning, MD  Encounter Date: 11/24/2014      PT End of Session - 11/24/14 1021    Visit Number 7   Number of Visits 16   Date for PT Re-Evaluation 12/29/14   PT Start Time 1018   PT Stop Time 1123   PT Time Calculation (min) 65 min   Activity Tolerance Patient tolerated treatment well      Past Medical History  Diagnosis Date  . Sleep apnea     uses cpap    Past Surgical History  Procedure Laterality Date  . Back surgery  1999  . Rotator cuff repair Right 2002  . Rotator cuff repair Left 2008  . Carpal tunnel release N/A 2014  . Trigger finger release Bilateral 2001-2016    multiple  . Knee arthroplasty Bilateral 2005  . Elbow debridement Right 2002  . Mandible surgery N/A 1986  . Trigger finger release Left 08/03/2014    Procedure: LEFT LONG FINGER TRIGGER RELEASE;  Surgeon: Mack Hook, MD;  Location: [facility];  Service: Orthopedics;  Laterality: Left;    There were no vitals filed for this visit.  Visit Diagnosis:  Pain in joint, shoulder region, left  Stiffness of shoulder joint, left  Upper limb weakness      Subjective Assessment - 11/24/14 1024    Subjective Pt reports that his pain is mainly in the lateral Lt upper arm, especially when he abducts and ER the Lt shoulder. Explained to pt mechanics of the shoulder and reassured that this is normal  in his healing process.    Currently in Pain? No/denies            [facility] PT Assessment - 11/24/14 0001    Assessment   Medical Diagnosis Lt shoulder AC resection & debridement   Onset Date/Surgical Date 10/22/14   Hand Dominance Left   Next MD Visit 12/09/14                     [facility]  Adult PT Treatment/Exercise - 11/24/14 0001    Shoulder Exercises: Supine   Horizontal ABduction Strengthening;Both;10 reps;Theraband  2 sets    Theraband Level (Shoulder Horizontal ABduction) Level 2 (Red)   External Rotation Strengthening;Both;Theraband  3x10   Theraband Level (Shoulder External Rotation) Level 2 (Red)   Flexion Both  2x10 overhead with yellow band   Shoulder Exercises: Sidelying   External Rotation Strengthening;Left  3x8, with 2#, towel under elbow   Other Sidelying Exercises 3x10 s/l empty with 1#, in small ROM   Shoulder Exercises: Standing   External Rotation --  3x10   Shoulder Exercises: ROM/Strengthening   UBE (Upper Arm Bike) L2 x 6" alt FWD/BWD   Modalities   Modalities Electrical Stimulation; cold pack   Cryotherapy   Number Minutes Cryotherapy 15 Minutes   Cryotherapy Location Shoulder   Type of Cryotherapy Ice pack  vaso in use   Electrical Stimulation   Electrical Stimulation Location Lt shoulder    Electrical Stimulation Action IFC   Electrical Stimulation Parameters to tolerance   Electrical Stimulation Goals Pain   Vasopneumatic   Number Minutes Vasopneumatic  --   Vasopnuematic Location  --   Vasopneumatic Pressure --   Vasopneumatic Temperature  --  Manual Therapy   Manual therapy comments deep tissue release/TP work through anterior Lt shoulder patient sitting and into supraspinatus                   PT Short Term Goals - 11/10/14 1157    PT SHORT TERM GOAL #1   Title I with initial HEP ( 12/01/14)   Time 4   Period Weeks   Status On-going   PT SHORT TERM GOAL #2   Title increase Lt shoulder ER =/> 85 degrees without pain ( 12/01/14)   Time 4   Period Weeks   Status On-going   PT SHORT TERM GOAL #3   Title demo Lt shoulder strength =/> 4+/5 without pain ( 12/01/14)    Time 4   Period Weeks   Status On-going   PT SHORT TERM GOAL #4   Title improve FOTO =/< 45% limited ( 12/01/14)   Time 4   Period Weeks   Status  On-going           PT Long Term Goals - 11/10/14 1157    PT LONG TERM GOAL #1   Title I with advanced HEP ( 12/29/14)    Time 8   Period Weeks   Status On-going   PT LONG TERM GOAL #2   Title demo Lt Ue strength =/> 5/5 ( 12/29/14)    Time 8   Period Weeks   Status On-going   PT LONG TERM GOAL #3   Title improve Lt grip =/> 100# ( 12/29/14)    Time 8   Period Weeks   Status On-going   PT LONG TERM GOAL #4   Title improve FOTO =/< 32% limited 9 12/29/14)    Time 8   Period Weeks   Status On-going   PT LONG TERM GOAL #5   Title if cleared by MD lift =/> 50# floor to chest level ( 12/29/14)   Time 8   Period Weeks   Status On-going               Plan - 11/24/14 1101    Clinical Impression Statement Pt with concerns regarding the discomfort he has with standing abduction and ER, explained what he is feeling is normal and he shouldn't be too concerned.  We will monitor it through therapy. He does still have some TP in the Lt shoulder musculature and postural changes that place stress on the shoulder joint.    Pt will benefit from skilled therapeutic intervention in order to improve on the following deficits Obesity;Impaired UE functional use;Pain;Decreased range of motion;Decreased strength   Rehab Potential Excellent   PT Frequency 2x / week   PT Duration 8 weeks   PT Treatment/Interventions Moist Heat;Ultrasound;Therapeutic exercise;Taping;Manual techniques;Neuromuscular re-education;Cryotherapy;Electrical Stimulation;Patient/family education;Scar mobilization;Passive range of motion   PT Next Visit Plan progress HEP   Consulted and Agree with Plan of Care Patient        Problem List There are no active problems to display for this patient.   Roderic Scarce PT 11/24/2014, 12:41 PM  [facility] [address] [place] [address] [place], [place], [address] Phone: [phone]   Fax:  [phone]

## 2014-11-27 ENCOUNTER — Ambulatory Visit (INDEPENDENT_AMBULATORY_CARE_PROVIDER_SITE_OTHER): Payer: Managed Care, Other (non HMO) | Admitting: Rehabilitative and Restorative Service Providers"

## 2014-11-27 DIAGNOSIS — M25612 Stiffness of left shoulder, not elsewhere classified: Secondary | ICD-10-CM | POA: Diagnosis not present

## 2014-11-27 DIAGNOSIS — R29898 Other symptoms and signs involving the musculoskeletal system: Secondary | ICD-10-CM | POA: Diagnosis not present

## 2014-11-27 DIAGNOSIS — M25512 Pain in left shoulder: Secondary | ICD-10-CM
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
## 2014-12-01 ENCOUNTER — Ambulatory Visit (INDEPENDENT_AMBULATORY_CARE_PROVIDER_SITE_OTHER): Payer: Managed Care, Other (non HMO) | Admitting: Rehabilitative and Restorative Service Providers"

## 2014-12-01 ENCOUNTER — Encounter: Payer: Self-pay | Admitting: Rehabilitative and Restorative Service Providers"

## 2014-12-01 DIAGNOSIS — R29898 Other symptoms and signs involving the musculoskeletal system: Secondary | ICD-10-CM | POA: Diagnosis not present

## 2014-12-01 DIAGNOSIS — M25512 Pain in left shoulder: Secondary | ICD-10-CM | POA: Diagnosis not present

## 2014-12-01 DIAGNOSIS — M25612 Stiffness of left shoulder, not elsewhere classified: Secondary | ICD-10-CM

## 2014-12-01 NOTE — Therapy (Signed)
[facility] [address] [place], [place], [address] Phone: [phone]   Fax:  [phone]  Physical Therapy Treatment  Patient Details  Name: Victor Brown MRN: [id number] Date of Birth: 1966/03/24 Referring Provider:  Marcene Corning, MD  Encounter Date: 12/01/2014      PT End of Session - 12/01/14 1016    Visit Number 9   Number of Visits 16   Date for PT Re-Evaluation 12/29/14   PT Start Time 1016   PT Stop Time 1123   PT Time Calculation (min) 67 min   Activity Tolerance Patient tolerated treatment well      Past Medical History  Diagnosis Date  . Sleep apnea     uses cpap    Past Surgical History  Procedure Laterality Date  . Back surgery  1999  . Rotator cuff repair Right 2002  . Rotator cuff repair Left 2008  . Carpal tunnel release N/A 2014  . Trigger finger release Bilateral 2001-2016    multiple  . Knee arthroplasty Bilateral 2005  . Elbow debridement Right 2002  . Mandible surgery N/A 1986  . Trigger finger release Left 08/03/2014    Procedure: LEFT LONG FINGER TRIGGER RELEASE;  Surgeon: Mack Hook, MD;  Location: [facility];  Service: Orthopedics;  Laterality: Left;    There were no vitals filed for this visit.  Visit Diagnosis:  Pain in joint, shoulder region, left  Stiffness of shoulder joint, left  Upper limb weakness      Subjective Assessment - 12/01/14 1017    Subjective Pt reports that yesterday with a "bad day" with his shoulder. He does not know why/does not know of any reason he would have increased pain. He continues to have increased pain today.    Currently in Pain? Yes   Pain Score 4    Pain Location Shoulder   Pain Orientation Left   Pain Descriptors / Indicators Dull;Aching   Pain Type Chronic pain            [facility] PT Assessment - 12/01/14 0001    Assessment   Medical Diagnosis Lt shoulder AC resection & debridement   Onset Date/Surgical  Date 10/22/14   Hand Dominance Left   Next MD Visit 12/09/14   AROM   Right/Left Shoulder Left  standing   Left Shoulder Extension 53 Degrees   Left Shoulder Flexion 161 Degrees   Left Shoulder ABduction 165 Degrees   Left Shoulder Internal Rotation 70 Degrees  shoulder at 90 deg abduction   Left Shoulder External Rotation 90 Degrees  shoulder at 90 deg abduction                     [facility] Adult PT Treatment/Exercise - 12/01/14 0001    Shoulder Exercises: Supine   Horizontal ABduction Strengthening;Both;10 reps;Theraband  2 sets    Theraband Level (Shoulder Horizontal ABduction) Level 2 (Red)   External Rotation Strengthening;Both;Theraband  3x10   Theraband Level (Shoulder External Rotation) Level 2 (Red)   Shoulder Exercises: Sidelying   External Rotation Strengthening;Left  3x8, with 2#, towel under elbow   Other Sidelying Exercises 3x10 s/l empty with 2#, in small ROM   Shoulder Exercises: Standing   External Rotation Strengthening;Both;Theraband  green TB   Theraband Level (Shoulder External Rotation) --  ER shd 90/elbow 90 body plane/ yellow TB 10x3   Extension Strengthening;Both;10 reps   Theraband Level (Shoulder Extension) Level 3 (Green)   Row Both;10 reps;Theraband  Theraband Level (Shoulder Row) Level 3 (Green)   Other Standing Exercises Scap squeeze with fom roll 10 sec old 10 reps    Other Standing Exercises adduction w/green TB x20 reps   Shoulder Exercises: ROM/Strengthening   UBE (Upper Arm Bike) L4 x 6" alt FWD/BWD   Wall Pushups 20 reps   Pushups Limitations ball in Lt hand  to increesad Lt UE work   Other ROM/Strengthening Exercises bouncing ball on wall overhead bilat ~1 min Lt only 1 min   Other ROM/Strengthening Exercises ER at 90/90 back to wall/ ball at dorsum of Lt hand 2 reps   Shoulder Exercises: Stretch   Other Shoulder Stretches doorway stretch lower and mid levels 20-30 sec hold 2 reps - higher level and doorway and overhead door  stretch 20 sec 2-3 reps no pain    Shoulder Exercises: Body Blade   Other Body Blade Exercises scaption with some elevatioin 1 min 2 reps   Modalities   Modalities Electrical Stimulation;Vasopneumatic   Electrical Stimulation   Electrical Stimulation Location Lt shoulder    Electrical Stimulation Action IFC   Electrical Stimulation Parameters to tolerance   Electrical Stimulation Goals Pain   Vasopneumatic   Number Minutes Vasopneumatic  15 minutes   Vasopnuematic Location  Shoulder   Vasopneumatic Pressure Medium   Vasopneumatic Temperature  3*   Manual Therapy   Manual therapy comments deep tissue release/TP work through anterior Lt shoulder patient supine into supraspinatus                 PT Education - 12/01/14 1121    Education provided Yes   Education Details Encouraged consistent HEP;  use of ice    Person(s) Educated Patient   Methods Explanation   Comprehension Verbalized understanding          PT Short Term Goals - 11/10/14 1157    PT SHORT TERM GOAL #1   Title I with initial HEP ( 12/01/14)   Time 4   Period Weeks   Status On-going   PT SHORT TERM GOAL #2   Title increase Lt shoulder ER =/> 85 degrees without pain ( 12/01/14)   Time 4   Period Weeks   Status On-going   PT SHORT TERM GOAL #3   Title demo Lt shoulder strength =/> 4+/5 without pain ( 12/01/14)    Time 4   Period Weeks   Status On-going   PT SHORT TERM GOAL #4   Title improve FOTO =/< 45% limited ( 12/01/14)   Time 4   Period Weeks   Status On-going           PT Long Term Goals - 11/10/14 1157    PT LONG TERM GOAL #1   Title I with advanced HEP ( 12/29/14)    Time 8   Period Weeks   Status On-going   PT LONG TERM GOAL #2   Title demo Lt Ue strength =/> 5/5 ( 12/29/14)    Time 8   Period Weeks   Status On-going   PT LONG TERM GOAL #3   Title improve Lt grip =/> 100# ( 12/29/14)    Time 8   Period Weeks   Status On-going   PT LONG TERM GOAL #4   Title improve FOTO =/<  32% limited 9 12/29/14)    Time 8   Period Weeks   Status On-going   PT LONG TERM GOAL #5   Title if cleared by MD lift =/> 50#  floor to chest level ( 12/29/14)   Time 8   Period Weeks   Status On-going               Plan - 12/01/14 1123    Clinical Impression Statement Increasd pain in the past two days and is beginning to note increased pain from his shoulder to arm at night - pain similar to what he experienced prior to surgery. Patient demonstrates some increase in ER Lt shoulder. Sees MD next week.   Pt will benefit from skilled therapeutic intervention in order to improve on the following deficits Obesity;Impaired UE functional use;Pain;Decreased range of motion;Decreased strength   Rehab Potential Good   PT Frequency 2x / week   PT Duration 8 weeks   PT Treatment/Interventions Moist Heat;Ultrasound;Therapeutic exercise;Taping;Manual techniques;Neuromuscular re-education;Cryotherapy;Electrical Stimulation;Patient/family education;Scar mobilization;Passive range of motion   PT Next Visit Plan progress HEP   PT Home Exercise Plan add ball on wall and wall push up with 4 in ball in palm to incresae work with Lt UE   Consulted and Agree with Plan of Care Patient        Problem List There are no active problems to display for this patient.   Val Riles, PT, MPH 12/01/2014, 11:27 AM  [facility] [address] [place], [place], [address] Phone: [phone]   Fax:  [phone]

## 2014-12-04 ENCOUNTER — Ambulatory Visit (INDEPENDENT_AMBULATORY_CARE_PROVIDER_SITE_OTHER): Payer: Managed Care, Other (non HMO) | Admitting: Physical Therapy

## 2014-12-04 DIAGNOSIS — R29898 Other symptoms and signs involving the musculoskeletal system: Secondary | ICD-10-CM | POA: Diagnosis not present

## 2014-12-04 DIAGNOSIS — M25612 Stiffness of left shoulder, not elsewhere classified: Secondary | ICD-10-CM | POA: Diagnosis not present

## 2014-12-04 DIAGNOSIS — M25512 Pain in left shoulder: Secondary | ICD-10-CM | POA: Diagnosis not present

## 2014-12-04 NOTE — Therapy (Signed)
[facility] [place], [place], [address] Phone: [phone]   Fax:  [phone]  Physical Therapy Treatment  Patient Details  Name: Victor Brown MRN: [id number] Date of Birth: March 22, 1966 Referring Provider:  Melrose Nakayama, MD  Encounter Date: 12/04/2014      PT End of Session - 12/04/14 1026    Visit Number 10   Number of Visits 16   Date for PT Re-Evaluation 12/29/14   PT Start Time 1022   PT Stop Time 1118   PT Time Calculation (min) 56 min      Past Medical History  Diagnosis Date  . Sleep apnea     uses cpap    Past Surgical History  Procedure Laterality Date  . Back surgery  1999  . Rotator cuff repair Right 2002  . Rotator cuff repair Left 2008  . Carpal tunnel release N/A 2014  . Trigger finger release Bilateral 2001-2016    multiple  . Knee arthroplasty Bilateral 2005  . Elbow debridement Right 2002  . Mandible surgery N/A 1986  . Trigger finger release Left 08/03/2014    Procedure: LEFT LONG FINGER TRIGGER RELEASE;  Surgeon: Milly Jakob, MD;  Location: [facility];  Service: Orthopedics;  Laterality: Left;    There were no vitals filed for this visit.  Visit Diagnosis:  Pain in joint, shoulder region, left  Upper limb weakness  Stiffness of shoulder joint, left      Subjective Assessment - 12/04/14 1026    Subjective "Yesterday Lt shoulder felt more grindy, not matter the position". Pt reports his Lt shoulder feels pretty good if just hanging by his side, but continues to be painful when abducted/ER. Pt reports he was very sore after last session.    Patient Stated Goals pt wishes to return to work and shooting.    Currently in Pain? Yes   Pain Score 4    Pain Location Shoulder   Pain Orientation Left   Pain Descriptors / Indicators Aching;Dull   Aggravating Factors  Abduction & ER in Lt shoulder   Pain Relieving Factors arm at rest, ice             [facility] PT Assessment - 12/04/14 0001    Assessment   Medical Diagnosis Lt shoulder AC resection & debridement   Onset Date/Surgical Date 10/22/14   Hand Dominance Left   Next MD Visit 12/09/14   Prior Therapy not for this   Strength   Strength Assessment Site Shoulder   Right/Left Shoulder Left   Left Shoulder Flexion 4-/5   Left Shoulder Extension 4+/5   Left Shoulder ABduction 3+/5  with pain   Left Shoulder Internal Rotation 4-/5   Left Shoulder External Rotation 3+/5  with pain                     [facility] Adult PT Treatment/Exercise - 12/04/14 0001    Shoulder Exercises: Standing   External Rotation Left;10 reps  2 sets   Theraband Level (Shoulder External Rotation) Level 2 (Red)   Internal Rotation 10 reps;Left;Strengthening  2 sets   Theraband Level (Shoulder Internal Rotation) Level 3 (Green)   Extension Strengthening;Both;20 reps;Theraband   Theraband Level (Shoulder Extension) Level 3 (Green)   Row Both;Strengthening;20 reps   Theraband Level (Shoulder Row) Level 3 (Green)   Other Standing Exercises adduction w/green TB x20 reps   Shoulder Exercises: Therapy Ball   Flexion 20 reps  CW/CCW (90 deg)  ABduction 20 reps  CW/CCW (90 deg -scaption )    ABduction Limitations VC to avoid IR of arm with hand on ball.    Shoulder Exercises: ROM/Strengthening   UBE (Upper Arm Bike) L4 x 6" alt forward/backward - standing   Other ROM/Strengthening Exercises ER at 90/90 back to wall/ ball at dorsum of Lt hand x 10 reps x 2 trials.    Shoulder Exercises: Stretch   Cross Chest Stretch 1 rep;20 seconds   Other Shoulder Stretches doorway stretch lower and mid levels 20-30 sec hold 2 reps - higher level and doorway and overhead door stretch 20 sec 2-3 reps- some pain with mid-level stretch despite position modificiations   Other Shoulder Stretches shoulder extension stretch x 20 sec   Modalities   Modalities Electrical Stimulation;Vasopneumatic   Electrical  Stimulation   Electrical Stimulation Location Lt shoulder    Electrical Stimulation Action IFC   Electrical Stimulation Parameters to tolerance x 15 min    Electrical Stimulation Goals Pain   Vasopneumatic   Number Minutes Vasopneumatic  15 minutes   Vasopnuematic Location  Shoulder   Vasopneumatic Pressure Medium   Vasopneumatic Temperature  3*                  PT Short Term Goals - 12/04/14 1044    PT SHORT TERM GOAL #1   Title I with initial HEP ( 12/01/14)   Time 4   Period Weeks   Status Achieved   PT SHORT TERM GOAL #2   Title increase Lt shoulder ER =/> 85 degrees without pain ( 12/01/14)   Time 4   Period Weeks   Status Achieved   PT SHORT TERM GOAL #3   Title demo Lt shoulder strength =/> 4+/5 without pain ( 12/01/14)    Time 4   Period Weeks   Status On-going   PT SHORT TERM GOAL #4   Title improve FOTO =/< 45% limited ( 12/01/14)   Time 4   Period Weeks   Status On-going           PT Long Term Goals - 11/10/14 1157    PT LONG TERM GOAL #1   Title I with advanced HEP ( 12/29/14)    Time 8   Period Weeks   Status On-going   PT LONG TERM GOAL #2   Title demo Lt Ue strength =/> 5/5 ( 12/29/14)    Time 8   Period Weeks   Status On-going   PT LONG TERM GOAL #3   Title improve Lt grip =/> 100# ( 12/29/14)    Time 8   Period Weeks   Status On-going   PT LONG TERM GOAL #4   Title improve FOTO =/< 32% limited 9 12/29/14)    Time 8   Period Weeks   Status On-going   PT LONG TERM GOAL #5   Title if cleared by MD lift =/> 50# floor to chest level ( 12/29/14)   Time 8   Period Weeks   Status On-going               Plan - 12/04/14 1254    Clinical Impression Statement Pt tolerated most exercises with only minimal increase in pain. Pt continues with pain when performing activities that involve abd/ER; reported up to 5-6/10 pain with those activities. Pt required frequent cues to correct form.  With form modifications, pain decreased.  Pain  decreased further with use of estim and vaso at end of  session. Pt has met STG #1 +2.    Pt will benefit from skilled therapeutic intervention in order to improve on the following deficits Obesity;Impaired UE functional use;Pain;Decreased range of motion;Decreased strength   Rehab Potential Good   PT Frequency 2x / week   PT Duration 8 weeks   PT Treatment/Interventions Moist Heat;Ultrasound;Therapeutic exercise;Taping;Manual techniques;Neuromuscular re-education;Cryotherapy;Electrical Stimulation;Patient/family education;Scar mobilization;Passive range of motion   PT Next Visit Plan Continue progressive ROM and strengthening to Lt shoulder.  Assess strength, ROM, and grip strength; write MD note for appt following day. (FOTO?)   Consulted and Agree with Plan of Care Patient        Problem List There are no active problems to display for this patient.   Kerin Perna, PTA 12/04/2014 1:02 PM  [place], [place], [address] Phone: [phone]   Fax:  [phone]

## 2014-12-08 ENCOUNTER — Encounter: Payer: Self-pay | Admitting: Rehabilitative and Restorative Service Providers"

## 2014-12-08 ENCOUNTER — Ambulatory Visit (INDEPENDENT_AMBULATORY_CARE_PROVIDER_SITE_OTHER): Payer: Managed Care, Other (non HMO) | Admitting: Rehabilitative and Restorative Service Providers"

## 2014-12-08 DIAGNOSIS — R29898 Other symptoms and signs involving the musculoskeletal system: Secondary | ICD-10-CM | POA: Diagnosis not present

## 2014-12-08 DIAGNOSIS — M25612 Stiffness of left shoulder, not elsewhere classified: Secondary | ICD-10-CM

## 2014-12-08 DIAGNOSIS — M25512 Pain in left shoulder: Secondary | ICD-10-CM | POA: Diagnosis not present

## 2014-12-08 NOTE — Therapy (Signed)
[place], [place], [address] Phone: [phone]   Fax:  [phone]  Physical Therapy Treatment  Patient Details  Name: Victor Brown MRN: [id number] Date of Birth: [birth year] Referring Provider:  Melrose Nakayama, MD  Encounter Date: 12/08/2014      PT End of Session - 12/08/14 1223    Visit Number 11   Number of Visits 16   Date for PT Re-Evaluation 12/29/14   PT Start Time 3354   PT Stop Time 1107   PT Time Calculation (min) 49 min   Activity Tolerance Patient tolerated treatment well      Past Medical History  Diagnosis Date  . Sleep apnea     uses cpap    Past Surgical History  Procedure Laterality Date  . Back surgery  1999  . Rotator cuff repair Right 2002  . Rotator cuff repair Left 2008  . Carpal tunnel release N/A 2014  . Trigger finger release Bilateral 2001-2016    multiple  . Knee arthroplasty Bilateral 2005  . Elbow debridement Right 2002  . Mandible surgery N/A 1986  . Trigger finger release Left 08/03/2014    Procedure: LEFT LONG FINGER TRIGGER RELEASE;  Surgeon: Milly Jakob, MD;  Location: [facility];  Service: Orthopedics;  Laterality: Left;    There were no vitals filed for this visit.  Visit Diagnosis:  Pain in joint, shoulder region, left  Upper limb weakness  Stiffness of shoulder joint, left      Subjective Assessment - 12/08/14 1021    Subjective Victor Brown reports that his shoulder has improved some - with better movement and strength. He can use Lt UE for more functional acitvities but continues to have pain, limited movement and weakness. RTD - Thursday 12/10/14.   Pain Score 3    Pain Location Shoulder   Pain Orientation Left   Pain Descriptors / Indicators Aching;Dull   Pain Type Chronic pain   Pain Onset More than a month ago   Pain Frequency Intermittent   Aggravating Factors  moving shoulder in certain positions -  extension/abduction and ER   Pain Relieving Factors rest; ice            [facility] PT Assessment - 12/08/14 0001    Assessment   Medical Diagnosis Lt shoulder AC resection & debridement   Onset Date/Surgical Date 10/22/14   Hand Dominance Left   Next MD Visit 12/09/14   Observation/Other Assessments   Focus on Therapeutic Outcomes (FOTO)  40% linmitation   AROM   Right/Left Shoulder Left  standing   Left Shoulder Extension 50 Degrees   Left Shoulder Flexion 160 Degrees   Left Shoulder ABduction 165 Degrees   Left Shoulder Internal Rotation 56 Degrees   Left Shoulder External Rotation 88 Degrees   PROM   Right/Left Shoulder --  ROM tested in supine - pain w/ER    Left Shoulder Flexion 180 Degrees   Left Shoulder ABduction 180 Degrees   Left Shoulder Internal Rotation 75 Degrees   Left Shoulder External Rotation 95 Degrees   Strength   Strength Assessment Site Shoulder   Right/Left Shoulder Left   Left Shoulder Flexion 4-/5  mild pain   Left Shoulder Extension --  5-/5   Left Shoulder ABduction 3+/5  mild pain   Left Shoulder Internal Rotation 4-/5  mild pain   Left Shoulder External Rotation 4+/5   Right Hand Grip (lbs) 135  2nd position avg of 3 trials  Left Hand Grip (lbs) 91  2nd position avg of 3 trials                     [facility] Adult PT Treatment/Exercise - 12/08/14 0001    Shoulder Exercises: Standing   External Rotation Left;10 reps  2 sets   Theraband Level (Shoulder External Rotation) Level 2 (Red)   Internal Rotation 10 reps;Left;Strengthening  2 sets   Theraband Level (Shoulder Internal Rotation) Level 3 (Green)   Extension Strengthening;Both;20 reps;Theraband   Theraband Level (Shoulder Extension) Level 3 (Green)   Row Both;Strengthening;20 reps   Theraband Level (Shoulder Row) Level 3 (Green)   Shoulder Exercises: ROM/Strengthening   UBE (Upper Arm Bike) L4 x 6" alt forward/backward - standing   Wall Pushups 20 reps   Pushups  Limitations ball in Lt hand  to increesad Lt UE work   Other ROM/Strengthening Exercises ER at 90/90 back to wall/ ball at dorsum of Lt hand 1 min 2 reps    Shoulder Exercises: Stretch   Other Shoulder Stretches doorway stretch lower and mid levels 20-30 sec hold 2 reps - higher level and doorway and overhead door stretch 20 sec 2-3 reps- some pain with mid-level stretch despite position modificiations   Shoulder Exercises: Body Blade   Other Body Blade Exercises scaption with some elevatioin 1 min 2 reps   Modalities   Modalities Electrical Stimulation;Vasopneumatic   Electrical Stimulation   Electrical Stimulation Location Lt shoulder    Electrical Stimulation Action IFC   Electrical Stimulation Parameters to tolerance   Electrical Stimulation Goals Pain   Vasopneumatic   Number Minutes Vasopneumatic  15 minutes   Vasopnuematic Location  Shoulder   Vasopneumatic Pressure Medium   Vasopneumatic Temperature  3*                PT Education - 12/08/14 1222    Education provided Yes   Education Details Encouraged consistent HEP working on mobility and strength; use of ice for pain management   Person(s) Educated Patient   Methods Explanation   Comprehension Verbalized understanding          PT Short Term Goals - 12/08/14 1227    PT SHORT TERM GOAL #1   Title I with initial HEP ( 12/01/14)   Time 4   Period Weeks   Status Achieved   PT SHORT TERM GOAL #2   Title increase Lt shoulder ER =/> 85 degrees without pain ( 12/01/14)   Time 4   Period Weeks   Status Achieved   PT SHORT TERM GOAL #3   Title demo Lt shoulder strength =/> 4+/5 without pain ( 12/01/14)    Baseline remains weak Lt UE abduction 3+/5   Time 4   Period Weeks   Status Partially Met   PT SHORT TERM GOAL #4   Title improve FOTO =/< 45% limited ( 12/01/14)   Baseline FOTO 40% limitation    Time 4   Period Weeks   Status Achieved           PT Long Term Goals - 12/08/14 1228    PT LONG TERM  GOAL #1   Title I with advanced HEP ( 12/29/14)    Time 8   Period Weeks   Status On-going   PT LONG TERM GOAL #2   Title demo Lt Ue strength =/> 5/5 ( 12/29/14)    Time 8   Period Weeks   Status On-going   PT LONG TERM  GOAL #3   Title improve Lt grip =/> 100# ( 12/29/14)    Baseline Grip 91 pounds   Time 8   Period Weeks   Status On-going   PT LONG TERM GOAL #4   Title improve FOTO =/< 32% limited 9 12/29/14)    Time 8   Period Weeks   Status On-going   PT LONG TERM GOAL #5   Title if cleared by MD lift =/> 50# floor to chest level ( 12/29/14)   Time 8   Period Weeks   Status On-going               Plan - 12/08/14 1224    Clinical Impression Statement Victor Brown demonstrates increased ROM, strenth and exercise tolerance. He continues to report pain on a daily basis and has remaining ROM and strength defecits. Victor Brown will benefit from continued PT to achieve goals and reach maximum rehab potential.     Pt will benefit from skilled therapeutic intervention in order to improve on the following deficits Obesity;Impaired UE functional use;Pain;Decreased range of motion;Decreased strength   Rehab Potential Good   PT Frequency 2x / week   PT Duration 8 weeks   PT Treatment/Interventions Moist Heat;Ultrasound;Therapeutic exercise;Taping;Manual techniques;Neuromuscular re-education;Cryotherapy;Electrical Stimulation;Patient/family education;Scar mobilization;Passive range of motion   PT Next Visit Plan Continue progressive ROM and strengthening to Lt shoulder.  Assess strength, ROM, and grip strength   PT Home Exercise Plan HEP   Consulted and Agree with Plan of Care Patient        Problem List There are no active problems to display for this patient.   [place], MPH 12/08/2014, 12:31 PM  [facility] [place], [place], [address] Phone: [phone]   Fax:  [phone]

## 2014-12-11 ENCOUNTER — Encounter: Payer: Self-pay | Admitting: Physical Therapy

## 2014-12-11 ENCOUNTER — Ambulatory Visit (INDEPENDENT_AMBULATORY_CARE_PROVIDER_SITE_OTHER): Payer: Managed Care, Other (non HMO) | Admitting: Physical Therapy

## 2014-12-11 DIAGNOSIS — M25612 Stiffness of left shoulder, not elsewhere classified: Secondary | ICD-10-CM | POA: Diagnosis not present

## 2014-12-11 DIAGNOSIS — R29898 Other symptoms and signs involving the musculoskeletal system: Secondary | ICD-10-CM

## 2014-12-11 DIAGNOSIS — M25512 Pain in left shoulder: Secondary | ICD-10-CM | POA: Diagnosis not present

## 2014-12-11 NOTE — Therapy (Signed)
[place], [place], [address] Phone: [phone]   Fax:  [phone]  Physical Therapy Treatment  Patient Details  Name: Victor Brown MRN: [id number] Date of Birth: 05/15/1965 Referring Provider:  Melrose Nakayama, MD  Encounter Date: 12/11/2014      PT End of Session - 12/11/14 1031    Visit Number 12   Number of Visits 16   Date for PT Re-Evaluation 12/29/14   PT Start Time 1020   PT Stop Time 1119   PT Time Calculation (min) 59 min   Activity Tolerance Patient tolerated treatment well      Past Medical History  Diagnosis Date  . Sleep apnea     uses cpap    Past Surgical History  Procedure Laterality Date  . Back surgery  1999  . Rotator cuff repair Right 2002  . Rotator cuff repair Left 2008  . Carpal tunnel release N/A 2014  . Trigger finger release Bilateral 2001-2016    multiple  . Knee arthroplasty Bilateral 2005  . Elbow debridement Right 2002  . Mandible surgery N/A 1986  . Trigger finger release Left 08/03/2014    Procedure: LEFT LONG FINGER TRIGGER RELEASE;  Surgeon: Milly Jakob, MD;  Location: [facility];  Service: Orthopedics;  Laterality: Left;    There were no vitals filed for this visit.  Visit Diagnosis:  Pain in joint, shoulder region, left  Upper limb weakness  Stiffness of shoulder joint, left      Subjective Assessment - 12/11/14 1023    Subjective Pt reports he is feeling the same, no real change in pain. Pt saw the MD earlier this week, he has been taken off all precautions, cleared to perform PRE's with goal of return to work.    Patient Stated Goals pt wishes to return to work and shooting.    Currently in Pain? Yes   Pain Score 3    Pain Orientation Left  top of shoulder   Pain Type Chronic pain;Neuropathic pain                         [facility] Adult PT Treatment/Exercise - 12/11/14 0001    Shoulder Exercises:  Supine   Horizontal ABduction Strengthening;Both;Theraband  30 reps   Theraband Level (Shoulder Horizontal ABduction) Level 4 (Blue)   Other Supine Exercises SASH 30 reps blue band Lt UE   Shoulder Exercises: Prone   Other Prone Exercises T's with 1# arm off EOB palm down, the 3x10 no wt, thumb up   Shoulder Exercises: Sidelying   External Rotation Strengthening;Left;Weights  3x10   External Rotation Weight (lbs) 3#   Shoulder Exercises: Standing   Other Standing Exercises counter top push ups x 10 with hands wide, 10 with hands close.    Other Standing Exercises 10 reps, 5 sec holds FWD reach bilat with 7#   Modalities   Modalities Electrical Stimulation;Vasopneumatic   Acupuncturist Location Lt shoulder    Electrical Stimulation Action IFC   Electrical Stimulation Parameters to tolerance   Electrical Stimulation Goals Pain   Vasopneumatic   Number Minutes Vasopneumatic  15 minutes   Vasopnuematic Location  Shoulder   Vasopneumatic Pressure Medium   Vasopneumatic Temperature  3*                  PT Short Term Goals - 12/08/14 1227    PT SHORT TERM GOAL #1  Title I with initial HEP ( 12/01/14)   Time 4   Period Weeks   Status Achieved   PT SHORT TERM GOAL #2   Title increase Lt shoulder ER =/> 85 degrees without pain ( 12/01/14)   Time 4   Period Weeks   Status Achieved   PT SHORT TERM GOAL #3   Title demo Lt shoulder strength =/> 4+/5 without pain ( 12/01/14)    Baseline remains weak Lt UE abduction 3+/5   Time 4   Period Weeks   Status Partially Met   PT SHORT TERM GOAL #4   Title improve FOTO =/< 45% limited ( 12/01/14)   Baseline FOTO 40% limitation    Time 4   Period Weeks   Status Achieved           PT Long Term Goals - 12/08/14 1228    PT LONG TERM GOAL #1   Title I with advanced HEP ( 12/29/14)    Time 8   Period Weeks   Status On-going   PT LONG TERM GOAL #2   Title demo Lt Ue strength =/> 5/5 ( 12/29/14)     Time 8   Period Weeks   Status On-going   PT LONG TERM GOAL #3   Title improve Lt grip =/> 100# ( 12/29/14)    Baseline Grip 91 pounds   Time 8   Period Weeks   Status On-going   PT LONG TERM GOAL #4   Title improve FOTO =/< 32% limited 9 12/29/14)    Time 8   Period Weeks   Status On-going   PT LONG TERM GOAL #5   Title if cleared by MD lift =/> 50# floor to chest level ( 12/29/14)   Time 8   Period Weeks   Status On-going               Problem List There are no active problems to display for this patient.   Jeral Pinch PT 12/11/2014, 12:01 PM  [facility] [place], [place], [address] Phone: [phone]   Fax:  [phone]

## 2014-12-15 ENCOUNTER — Ambulatory Visit (INDEPENDENT_AMBULATORY_CARE_PROVIDER_SITE_OTHER): Payer: Managed Care, Other (non HMO) | Admitting: Physical Therapy

## 2014-12-15 ENCOUNTER — Encounter: Payer: Self-pay | Admitting: Physical Therapy

## 2014-12-15 DIAGNOSIS — M25512 Pain in left shoulder: Secondary | ICD-10-CM

## 2014-12-15 DIAGNOSIS — M25612 Stiffness of left shoulder, not elsewhere classified: Secondary | ICD-10-CM | POA: Diagnosis not present

## 2014-12-15 DIAGNOSIS — R29898 Other symptoms and signs involving the musculoskeletal system: Secondary | ICD-10-CM

## 2014-12-15 NOTE — Therapy (Signed)
[place], [place], [address] Phone: [phone]   Fax:  [phone]  Physical Therapy Treatment  Patient Details  Name: Victor Brown MRN: [id number] Date of Birth: 1965-09-22 Referring Provider:  Melrose Nakayama, MD  Encounter Date: 12/15/2014      PT End of Session - 12/15/14 1027    Visit Number 13   Number of Visits 16   Date for PT Re-Evaluation 12/29/14   PT Start Time 1019   PT Stop Time 1113   PT Time Calculation (min) 54 min      Past Medical History  Diagnosis Date  . Sleep apnea     uses cpap    Past Surgical History  Procedure Laterality Date  . Back surgery  1999  . Rotator cuff repair Right 2002  . Rotator cuff repair Left 2008  . Carpal tunnel release N/A 2014  . Trigger finger release Bilateral 2001-2016    multiple  . Knee arthroplasty Bilateral 2005  . Elbow debridement Right 2002  . Mandible surgery N/A 1986  . Trigger finger release Left 08/03/2014    Procedure: LEFT LONG FINGER TRIGGER RELEASE;  Surgeon: Milly Jakob, MD;  Location: [facility];  Service: Orthopedics;  Laterality: Left;    There were no vitals filed for this visit.  Visit Diagnosis:  Pain in joint, shoulder region, left  Upper limb weakness  Stiffness of shoulder joint, left      Subjective Assessment - 12/15/14 1019    Subjective Pt reports he has been sore in the shoulder with the new exercises    Patient Stated Goals pt wishes to return to work and shooting.    Currently in Pain? Yes   Pain Score 3    Pain Location Shoulder   Pain Orientation Left;Upper   Pain Descriptors / Indicators Aching;Dull   Pain Type Chronic pain   Pain Onset More than a month ago   Pain Frequency Constant   Aggravating Factors  exercises   Pain Relieving Factors rest and ice            [facility] PT Assessment - 12/15/14 0001    Assessment   Medical Diagnosis Lt shoulder AC resection &  debridement   Onset Date/Surgical Date 10/22/14   Hand Dominance Left   Next MD Visit 01/06/15   Strength   Strength Assessment Site Shoulder   Right/Left Shoulder Left   Left Shoulder Flexion 4+/5   Left Shoulder ABduction 4+/5   Left Shoulder Internal Rotation 5/5   Left Shoulder External Rotation 4+/5   Left Hand Grip (lbs) 120#                     [facility] Adult PT Treatment/Exercise - 12/15/14 0001    Shoulder Exercises: Supine   External Rotation --  ball bouncing on wall in abduction.    Other Supine Exercises --   Shoulder Exercises: Prone   Other Prone Exercises plank walks around the mat x 10   Shoulder Exercises: Standing   External Rotation Strengthening;Theraband  30 reps with arm in abduciton   Theraband Level (Shoulder External Rotation) Level 3 (Green)   Flexion Both;10 reps;Weights  10sec hold at 90 degrees   Shoulder Flexion Weight (lbs) 10   Other Standing Exercises lifting floor to/from waist 35#   Shoulder Exercises: ROM/Strengthening   UBE (Upper Arm Bike) L7 x 7'' alt FWD/BDW   Other ROM/Strengthening Exercises body blade with mov't  flex, diagonals   Modalities   Modalities Programmer, applications Action IFC   Electrical Stimulation Parameters to tolerance   Electrical Stimulation Goals Pain   Vasopneumatic   Number Minutes Vasopneumatic  15 minutes   Vasopnuematic Location  Shoulder   Vasopneumatic Pressure Medium   Vasopneumatic Temperature  3*                  PT Short Term Goals - 12/15/14 1035    PT SHORT TERM GOAL #1   Title I with initial HEP ( 12/01/14)   Status Achieved   PT SHORT TERM GOAL #2   Title increase Lt shoulder ER =/> 85 degrees without pain ( 12/01/14)   Status Achieved   PT SHORT TERM GOAL #3   Title demo Lt shoulder strength =/> 4+/5 without pain ( 12/01/14)    Status Achieved   PT SHORT  TERM GOAL #4   Title improve FOTO =/< 45% limited ( 12/01/14)   Status Achieved           PT Long Term Goals - 12/15/14 1039    PT LONG TERM GOAL #1   Title I with advanced HEP ( 12/29/14)    Status On-going   PT LONG TERM GOAL #2   Title demo Lt Ue strength =/> 5/5 ( 12/29/14)    Status On-going   PT LONG TERM GOAL #3   Title improve Lt grip =/> 100# ( 12/29/14)    Status Achieved   PT LONG TERM GOAL #4   Title improve FOTO =/< 32% limited 9 12/29/14)    Status On-going   PT LONG TERM GOAL #5   Title if cleared by MD lift =/> 50# floor to chest level ( 12/29/14)   Status On-going               Plan - 12/15/14 1100    Clinical Impression Statement Pt with some soreness with new exercises, this is to be expected. He tolerated some lifting today and met another goal set. Starting to simulate some work and leisure activites movements   Pt will benefit from skilled therapeutic intervention in order to improve on the following deficits Obesity;Impaired UE functional use;Pain;Decreased range of motion;Decreased strength   Rehab Potential Good   PT Frequency 2x / week   PT Duration 8 weeks   PT Treatment/Interventions Moist Heat;Ultrasound;Therapeutic exercise;Taping;Manual techniques;Neuromuscular re-education;Cryotherapy;Electrical Stimulation;Patient/family education;Scar mobilization;Passive range of motion   PT Next Visit Plan progress strengthening and work Office manager with Plan of Care Patient        Problem List There are no active problems to display for this patient.   Jeral Pinch PT 12/15/2014, 11:03 AM  [facility] [place], [place], [address] Phone: [phone]   Fax:  [phone]

## 2014-12-18 ENCOUNTER — Ambulatory Visit (INDEPENDENT_AMBULATORY_CARE_PROVIDER_SITE_OTHER): Payer: Managed Care, Other (non HMO) | Admitting: Physical Therapy

## 2014-12-18 DIAGNOSIS — R29898 Other symptoms and signs involving the musculoskeletal system: Secondary | ICD-10-CM | POA: Diagnosis not present

## 2014-12-18 DIAGNOSIS — M25512 Pain in left shoulder: Secondary | ICD-10-CM

## 2014-12-18 DIAGNOSIS — M25612 Stiffness of left shoulder, not elsewhere classified: Secondary | ICD-10-CM

## 2014-12-18 NOTE — Therapy (Signed)
[facility] [place] [address] [place], [place], [address] Phone: [phone]   Fax:  [phone]  Physical Therapy Treatment  Patient Details  Name: Victor Brown MRN: [id number] Date of Birth: 1965-08-01 Referring Provider:  Marcene Corning, MD  Encounter Date: 12/18/2014      PT End of Session - 12/18/14 1019    Visit Number 14   Number of Visits 16   Date for PT Re-Evaluation 12/29/14   PT Start Time 1019   PT Stop Time 1117   PT Time Calculation (min) 58 min      Past Medical History  Diagnosis Date  . Sleep apnea     uses cpap    Past Surgical History  Procedure Laterality Date  . Back surgery  1999  . Rotator cuff repair Right 2002  . Rotator cuff repair Left 2008  . Carpal tunnel release N/A 2014  . Trigger finger release Bilateral 2001-2016    multiple  . Knee arthroplasty Bilateral 2005  . Elbow debridement Right 2002  . Mandible surgery N/A 1986  . Trigger finger release Left 08/03/2014    Procedure: LEFT LONG FINGER TRIGGER RELEASE;  Surgeon: Mack Hook, MD;  Location: [facility];  Service: Orthopedics;  Laterality: Left;    There were no vitals filed for this visit.  Visit Diagnosis:  Pain in joint, shoulder region, left  Upper limb weakness  Stiffness of shoulder joint, left      Subjective Assessment - 12/18/14 1019    Subjective Pt has nothing new to report.    Currently in Pain? Yes   Pain Score 3    Pain Location Shoulder   Pain Orientation Left;Upper   Pain Descriptors / Indicators Aching;Dull   Aggravating Factors  exercises   Pain Relieving Factors rest and ice            [facility] PT Assessment - 12/18/14 0001    Assessment   Medical Diagnosis Lt shoulder AC resection & debridement   Onset Date/Surgical Date 10/22/14   Hand Dominance Left   Next MD Visit 01/06/15           [facility] Adult PT Treatment/Exercise - 12/18/14 0001    Elbow Exercises   Elbow  Flexion Left;10 reps;Standing;Bar weights/barbell  8# in hand   Shoulder Exercises: Seated   Row 10 reps;Theraband;Both  2 sets   Theraband Level (Shoulder Row) --  black   Other Seated Exercises lat pull down bilat with 3 plates x 10 reps, 2 sets   Shoulder Exercises: Standing   External Rotation Strengthening;Left;12 reps  2 sets   Theraband Level (Shoulder External Rotation) Level 3 (Green)   Other Standing Exercises Waist to shoulder height lift with 10# plate x 20 reps    Other Standing Exercises tricep extension with 8# wt on Lt x 10 reps, Lt tricep stretch x 30 sec x 2    Shoulder Exercises: ROM/Strengthening   UBE (Upper Arm Bike) L7 x 7'' alt FWD/BDW   Other ROM/Strengthening Exercises body blade with mov't flex, diagonals,  2 reps x 1 min    Shoulder Exercises: Stretch   Other Shoulder Stretches doorway stretch lower and mid levels 20-30 sec hold 2 reps - higher level and doorway and overhead door stretch 20 sec 2-3 reps   Modalities   Modalities Electrical Stimulation;Vasopneumatic   Electrical Stimulation   Electrical Stimulation Location Lt shoulder   Electrical Stimulation Action IFC   Electrical Stimulation Parameters to tolerance  Electrical Stimulation Goals Pain   Vasopneumatic   Number Minutes Vasopneumatic  15 minutes   Vasopnuematic Location  Shoulder   Vasopneumatic Pressure Medium   Vasopneumatic Temperature  3*          PT Short Term Goals - 12/15/14 1035    PT SHORT TERM GOAL #1   Title I with initial HEP ( 12/01/14)   Status Achieved   PT SHORT TERM GOAL #2   Title increase Lt shoulder ER =/> 85 degrees without pain ( 12/01/14)   Status Achieved   PT SHORT TERM GOAL #3   Title demo Lt shoulder strength =/> 4+/5 without pain ( 12/01/14)    Status Achieved   PT SHORT TERM GOAL #4   Title improve FOTO =/< 45% limited ( 12/01/14)   Status Achieved           PT Long Term Goals - 12/15/14 1039    PT LONG TERM GOAL #1   Title I with advanced  HEP ( 12/29/14)    Status On-going   PT LONG TERM GOAL #2   Title demo Lt Ue strength =/> 5/5 ( 12/29/14)    Status On-going   PT LONG TERM GOAL #3   Title improve Lt grip =/> 100# ( 12/29/14)    Status Achieved   PT LONG TERM GOAL #4   Title improve FOTO =/< 32% limited 9 12/29/14)    Status On-going   PT LONG TERM GOAL #5   Title if cleared by MD lift =/> 50# floor to chest level ( 12/29/14)   Status On-going               Plan - 12/18/14 1103    Clinical Impression Statement Pt tolerated all exercises with minimal increase in pain.  Pt tolerated work simulated lifting well.  Pain reduced at end of treatment with use of estim and vaso. Progressing towards goals.    Pt will benefit from skilled therapeutic intervention in order to improve on the following deficits Obesity;Impaired UE functional use;Pain;Decreased range of motion;Decreased strength   Rehab Potential Good   PT Frequency 2x / week   PT Duration 8 weeks   PT Treatment/Interventions Moist Heat;Ultrasound;Therapeutic exercise;Taping;Manual techniques;Neuromuscular re-education;Cryotherapy;Electrical Stimulation;Patient/family education;Scar mobilization;Passive range of motion   PT Next Visit Plan progress strengthening and work Energy manager with Plan of Care Patient        Problem List There are no active problems to display for this patient.   Mayer Camel, PTA 12/18/2014 11:05 AM   [facility] [address] [address] [place], [place], [address] Phone: [phone]   Fax:  [phone]

## 2014-12-22 ENCOUNTER — Ambulatory Visit (INDEPENDENT_AMBULATORY_CARE_PROVIDER_SITE_OTHER): Payer: Managed Care, Other (non HMO) | Admitting: Physical Therapy

## 2014-12-22 DIAGNOSIS — M25512 Pain in left shoulder: Secondary | ICD-10-CM

## 2014-12-22 DIAGNOSIS — R29898 Other symptoms and signs involving the musculoskeletal system: Secondary | ICD-10-CM | POA: Diagnosis not present

## 2014-12-22 DIAGNOSIS — M25612 Stiffness of left shoulder, not elsewhere classified: Secondary | ICD-10-CM | POA: Diagnosis not present

## 2014-12-22 NOTE — Therapy (Signed)
[facility] [place] [address] [place], [place], [address] Phone: [phone]   Fax:  [phone]  Physical Therapy Treatment  Patient Details  Name: Victor Brown MRN: [id number] Date of Birth: [birth year] Referring Provider:  Marcene Corning, MD  Encounter Date: 12/22/2014      PT End of Session - 12/22/14 1021    Visit Number 15   Number of Visits 16   Date for PT Re-Evaluation 12/29/14   PT Start Time 1017   PT Stop Time 1121   PT Time Calculation (min) 64 min      Past Medical History  Diagnosis Date  . Sleep apnea     uses cpap    Past Surgical History  Procedure Laterality Date  . Back surgery  1999  . Rotator cuff repair Right 2002  . Rotator cuff repair Left 2008  . Carpal tunnel release N/A 2014  . Trigger finger release Bilateral 2001-2016    multiple  . Knee arthroplasty Bilateral 2005  . Elbow debridement Right 2002  . Mandible surgery N/A 1986  . Trigger finger release Left 08/03/2014    Procedure: LEFT LONG FINGER TRIGGER RELEASE;  Surgeon: Mack Hook, MD;  Location: [facility];  Service: Orthopedics;  Laterality: Left;    There were no vitals filed for this visit.  Visit Diagnosis:  Pain in joint, shoulder region, left  Upper limb weakness  Stiffness of shoulder joint, left      Subjective Assessment - 12/22/14 1022    Subjective Pt reports the shoulder pain/ache is getting less and less. continues to have pain with abduction and ER.    Currently in Pain? No/denies            [facility] PT Assessment - 12/22/14 0001    Assessment   Medical Diagnosis Lt shoulder AC resection & debridement   Onset Date/Surgical Date 10/22/14   Hand Dominance Left   Next MD Visit 01/06/15   Strength   Strength Assessment Site Shoulder   Right/Left Shoulder Left   Left Shoulder Flexion --  5-/5   Left Shoulder ABduction 4+/5   Left Shoulder Internal Rotation 5/5   Left Shoulder  External Rotation 4+/5                     [facility] Adult PT Treatment/Exercise - 12/22/14 0001    Shoulder Exercises: Prone   Other Prone Exercises plank on fitter, 2 blue bands 3x5'   Shoulder Exercises: Standing   External Rotation Strengthening;Both;Theraband  with slight abduction   Theraband Level (Shoulder External Rotation) Level 4 (Blue)   Flexion Left;Strengthening;Weights   Theraband Level (Shoulder Flexion) --  OH cone stacking 30 sec 5x1'   Shoulder Flexion Weight (lbs) 4   Shoulder Exercises: ROM/Strengthening   UBE (Upper Arm Bike) L7 x 7'' alt FWD/BDW   Other ROM/Strengthening Exercises lifting 35# knees to/from shoulder height x 10   Modalities   Modalities Electrical Stimulation;Vasopneumatic   Electrical Stimulation   Electrical Stimulation Location Lt shoulder   Electrical Stimulation Action IFC   Electrical Stimulation Parameters to tolerance   Electrical Stimulation Goals Pain   Vasopneumatic   Number Minutes Vasopneumatic  15 minutes   Vasopnuematic Location  Shoulder   Vasopneumatic Pressure Medium   Vasopneumatic Temperature  3*   Manual Therapy   Manual therapy comments TPR to Lt teres minor/major.  PT Short Term Goals - 12/15/14 1035    PT SHORT TERM GOAL #1   Title I with initial HEP ( 12/01/14)   Status Achieved   PT SHORT TERM GOAL #2   Title increase Lt shoulder ER =/> 85 degrees without pain ( 12/01/14)   Status Achieved   PT SHORT TERM GOAL #3   Title demo Lt shoulder strength =/> 4+/5 without pain ( 12/01/14)    Status Achieved   PT SHORT TERM GOAL #4   Title improve FOTO =/< 45% limited ( 12/01/14)   Status Achieved           PT Long Term Goals - 12/15/14 1039    PT LONG TERM GOAL #1   Title I with advanced HEP ( 12/29/14)    Status On-going   PT LONG TERM GOAL #2   Title demo Lt Ue strength =/> 5/5 ( 12/29/14)    Status On-going   PT LONG TERM GOAL #3   Title improve Lt grip =/> 100# (  12/29/14)    Status Achieved   PT LONG TERM GOAL #4   Title improve FOTO =/< 32% limited 9 12/29/14)    Status On-going   PT LONG TERM GOAL #5   Title if cleared by MD lift =/> 50# floor to chest level ( 12/29/14)   Status On-going               Plan - 12/22/14 1110    Clinical Impression Statement Pt had some difficutlites with overhead activities today, Responded well to TPR to teres minor/major.    Pt will benefit from skilled therapeutic intervention in order to improve on the following deficits Obesity;Impaired UE functional use;Pain;Decreased range of motion;Decreased strength   Rehab Potential Good   PT Frequency 2x / week   PT Duration 8 weeks   PT Treatment/Interventions Moist Heat;Ultrasound;Therapeutic exercise;Taping;Manual techniques;Neuromuscular re-education;Cryotherapy;Electrical Stimulation;Patient/family education;Scar mobilization;Passive range of motion   PT Next Visit Plan send renewal for tx through the end of the month   Consulted and Agree with Plan of Care Patient        Problem List There are no active problems to display for this patient.   Darl Pikes shaver PT 12/22/2014, 11:12 AM  [facility] [address] [place], [place], [address] Phone: [phone]   Fax:  [phone]

## 2014-12-25 ENCOUNTER — Ambulatory Visit (INDEPENDENT_AMBULATORY_CARE_PROVIDER_SITE_OTHER): Payer: Managed Care, Other (non HMO) | Admitting: Physical Therapy

## 2014-12-25 DIAGNOSIS — R29898 Other symptoms and signs involving the musculoskeletal system: Secondary | ICD-10-CM

## 2014-12-25 DIAGNOSIS — M25512 Pain in left shoulder: Secondary | ICD-10-CM

## 2014-12-25 DIAGNOSIS — M25612 Stiffness of left shoulder, not elsewhere classified: Secondary | ICD-10-CM

## 2014-12-25 NOTE — Therapy (Signed)
[facility] [place] [address] [place], [place], [address] Phone: [phone]   Fax:  [phone]  Physical Therapy Treatment  Patient Details  Name: Victor Brown MRN: [id number] Date of Birth: 10/28/1965 Referring Provider:  Marcene Corning, MD  Encounter Date: 12/25/2014      PT End of Session - 12/25/14 1021    Visit Number 16   Number of Visits 16   Date for PT Re-Evaluation 12/29/14   PT Start Time 1018   PT Stop Time 1118   PT Time Calculation (min) 60 min      Past Medical History  Diagnosis Date  . Sleep apnea     uses cpap    Past Surgical History  Procedure Laterality Date  . Back surgery  1999  . Rotator cuff repair Right 2002  . Rotator cuff repair Left 2008  . Carpal tunnel release N/A 2014  . Trigger finger release Bilateral 2001-2016    multiple  . Knee arthroplasty Bilateral 2005  . Elbow debridement Right 2002  . Mandible surgery N/A 1986  . Trigger finger release Left 08/03/2014    Procedure: LEFT LONG FINGER TRIGGER RELEASE;  Surgeon: Mack Hook, MD;  Location: [facility];  Service: Orthopedics;  Laterality: Left;    There were no vitals filed for this visit.  Visit Diagnosis:  Pain in joint, shoulder region, left  Upper limb weakness  Stiffness of shoulder joint, left      Subjective Assessment - 12/25/14 1021    Subjective Pt reports he noticed he has pain in Lt shoulder with Lt cervical rotation. Also has increased pain with slight abd/ER (a little more than usual).     Currently in Pain? Yes   Pain Score 3    Pain Location Shoulder   Pain Orientation Left;Upper   Pain Descriptors / Indicators Aching;Dull   Aggravating Factors  exercise   Pain Relieving Factors ice and rest             [facility] PT Assessment - 12/25/14 0001    Assessment   Medical Diagnosis Lt shoulder AC resection & debridement   Onset Date/Surgical Date 10/22/14   Hand Dominance Left   Next MD Visit 01/06/15   Strength   Strength Assessment Site Shoulder   Right/Left Shoulder Left   Left Shoulder Flexion --  5-/5   Left Shoulder ABduction 4+/5   Left Shoulder Internal Rotation 5/5   Left Shoulder External Rotation 4+/5           [facility] Adult PT Treatment/Exercise - 12/25/14 0001    Shoulder Exercises: Seated   Other Seated Exercises Lateral flexion neck stretch x 30 sec x 2 reps each side; levator stretch x 30 sec x 2 reps each side.    Shoulder Exercises: Standing   Other Standing Exercises cone stacking overhead to return arm at side x 3 min; with 2# on Lt wrist x 2 min.   Waist to shoulder height lifting 2# - 9# weights - mulitple reps.    Shoulder Exercises: ROM/Strengthening   UBE (Upper Arm Bike) L7 x 7'' alt FWD/BDW   Shoulder Exercises: Stretch   Other Shoulder Stretches doorway stretch lower and mid levels 20-30 sec hold 2 reps - higher level and doorway and overhead door stretch 20 sec 2-3 reps   Shoulder Exercises: Body Blade   External Rotation 30 seconds;2 reps  difficult   Other Body Blade Exercises Horizontal ABD/ADD (D1 pattern) x 1 min,x 2  sets   Modalities   Modalities Office manager Location Lt shoulder, thoracic paraspinals    Electrical Stimulation Action IFC    Electrical Stimulation Parameters to tolerance   Electrical Stimulation Goals Pain   Vasopneumatic   Number Minutes Vasopneumatic  15 minutes   Vasopnuematic Location  Shoulder   Vasopneumatic Pressure Medium   Vasopneumatic Temperature  3*   Manual Therapy   Manual therapy comments MFR and TPR to Lt pec, Lt paraspinals in lower cervical/ upper thoracic - very point tender; some guarding.  Pt instructed on self massage with ball to Lt pec/ rhomboid.  Pt able to return demo with ball; held each tender spot ~1 min.            PT Education - 12/25/14 1247    Education provided Yes   Education Details self  care: pt encouraged to try self massage with ball to tight areas of Lt shoulder (pec/rhomboid/trap)   Person(s) Educated Patient   Methods Explanation;Demonstration   Comprehension Returned demonstration;Verbalized understanding          PT Short Term Goals - 12/15/14 1035    PT SHORT TERM GOAL #1   Title I with initial HEP ( 12/01/14)   Status Achieved   PT SHORT TERM GOAL #2   Title increase Lt shoulder ER =/> 85 degrees without pain ( 12/01/14)   Status Achieved   PT SHORT TERM GOAL #3   Title demo Lt shoulder strength =/> 4+/5 without pain ( 12/01/14)    Status Achieved   PT SHORT TERM GOAL #4   Title improve FOTO =/< 45% limited ( 12/01/14)   Status Achieved           PT Long Term Goals - 12/15/14 1039    PT LONG TERM GOAL #1   Title I with advanced HEP ( 12/29/14)    Status On-going   PT LONG TERM GOAL #2   Title demo Lt Ue strength =/> 5/5 ( 12/29/14)    Status On-going   PT LONG TERM GOAL #3   Title improve Lt grip =/> 100# ( 12/29/14)    Status Achieved   PT LONG TERM GOAL #4   Title improve FOTO =/< 32% limited 9 12/29/14)    Status On-going   PT LONG TERM GOAL #5   Title if cleared by MD lift =/> 50# floor to chest level ( 12/29/14)   Status On-going               Plan - 12/25/14 1244    Clinical Impression Statement Pt presented with increased soreness in Lt shoulder pt believes to be from increasing resistance with HEP exercises.  Pt able to tolerate overhead activities a little better today; required some rest breaks due to muscle fatigue.  Pt making good progress towards remaining goals. Will benfit from continued PT intervention to maximize function.    Pt will benefit from skilled therapeutic intervention in order to improve on the following deficits Obesity;Impaired UE functional use;Pain;Decreased range of motion;Decreased strength   Rehab Potential Good   PT Frequency 2x / week   PT Duration 8 weeks   PT Treatment/Interventions Moist  Heat;Ultrasound;Therapeutic exercise;Taping;Manual techniques;Neuromuscular re-education;Cryotherapy;Electrical Stimulation;Patient/family education;Scar mobilization;Passive range of motion   PT Next Visit Plan Spoke to supervising PT regarding pt's progress and desire to continue therapy.  Will continue progressive strengthening for LUE.     Consulted and Agree with Plan of Care Patient  Problem List There are no active problems to display for this patient.   Mayer Camel, PTA 12/25/2014 12:52 PM  [facility] [place] [address] [place], [place], [address] Phone: [phone]   Fax:  [phone]

## 2014-12-28 ENCOUNTER — Ambulatory Visit (INDEPENDENT_AMBULATORY_CARE_PROVIDER_SITE_OTHER): Payer: Managed Care, Other (non HMO) | Admitting: Physical Therapy

## 2014-12-28 DIAGNOSIS — M25512 Pain in left shoulder: Secondary | ICD-10-CM

## 2014-12-28 DIAGNOSIS — R29898 Other symptoms and signs involving the musculoskeletal system: Secondary | ICD-10-CM

## 2014-12-28 DIAGNOSIS — M25612 Stiffness of left shoulder, not elsewhere classified: Secondary | ICD-10-CM | POA: Diagnosis not present

## 2014-12-28 NOTE — Therapy (Addendum)
[facility] [place] [address] [place], [place], [address] Phone: [phone]   Fax:  [phone]  Physical Therapy Treatment  Patient Details  Name: Victor Brown MRN: [id number] Date of Birth: October 05, 1965 Referring Provider:  Marcene Corning, MD  Encounter Date: 12/28/2014      PT End of Session - 12/28/14 1027    Visit Number 17   Number of Visits 24   Date for PT Re-Evaluation 01/25/15   PT Start Time 1018   PT Stop Time 1116   PT Time Calculation (min) 58 min      Past Medical History  Diagnosis Date  . Sleep apnea     uses cpap    Past Surgical History  Procedure Laterality Date  . Back surgery  1999  . Rotator cuff repair Right 2002  . Rotator cuff repair Left 2008  . Carpal tunnel release N/A 2014  . Trigger finger release Bilateral 2001-2016    multiple  . Knee arthroplasty Bilateral 2005  . Elbow debridement Right 2002  . Mandible surgery N/A 1986  . Trigger finger release Left 08/03/2014    Procedure: LEFT LONG FINGER TRIGGER RELEASE;  Surgeon: Mack Hook, MD;  Location: [facility];  Service: Orthopedics;  Laterality: Left;    There were no vitals filed for this visit.  Visit Diagnosis:  Pain in joint, shoulder region, left - Plan: PT plan of care cert/re-cert  Upper limb weakness - Plan: PT plan of care cert/re-cert  Stiffness of shoulder joint, left - Plan: PT plan of care cert/re-cert      Subjective Assessment - 12/28/14 1025    Currently in Pain? Yes   Pain Score 3    Pain Location Shoulder   Pain Orientation Left;Upper   Pain Descriptors / Indicators Aching;Dull  "pulling"    Aggravating Factors  resistance exercises    Pain Relieving Factors ice, rest             [facility] PT Assessment - 12/28/14 0001    Assessment   Medical Diagnosis Lt shoulder AC resection & debridement   Onset Date/Surgical Date 10/22/14   Hand Dominance Left   Next MD Visit 01/06/15   AROM   Left Shoulder Extension 53 Degrees   Left Shoulder Flexion 172 Degrees, standing   Left Shoulder ABduction 175 Degrees, standing   Left Shoulder Internal Rotation 67 Degrees   Left Shoulder External Rotation 90 Degrees after ROM,  Initially 77 deg with 90 deg abd in supine.            [facility] Adult PT Treatment/Exercise - 12/28/14 0001    Shoulder Exercises: Standing   Other Standing Exercises cone stacking waist to overhead x 4 min with 2# weight on Lt wrist.     Shoulder Exercises: ROM/Strengthening   UBE (Upper Arm Bike) L8 x 8 min (alternating each direction). Standing.    Shoulder Exercises: Stretch   Other Shoulder Stretches doorway stretch lower and mid levels 20-30 sec hold 2 reps - higher level and doorway and overhead door stretch 20 sec 2-3 reps   Other Shoulder Stretches lateral neck flexion with Lt arm behind back x 30 sec x 2 reps.    Modalities   Modalities Insurance account manager Location Lt shoulder, thoracic paraspinals    Electrical Stimulation Action IFC   Electrical Stimulation Parameters to tolerance    Electrical Stimulation Goals Pain   Vasopneumatic   Number Minutes  Vasopneumatic  15 minutes   Vasopnuematic Location  Shoulder   Vasopneumatic Pressure Medium   Vasopneumatic Temperature  3*   Manual Therapy   Manual Therapy Passive ROM;Soft tissue mobilization   Manual therapy comments MFR and TPR to Lt upper thoracic paraspinals, levator scalenes, upper trap, pec.   Pec release    Passive ROM Lt shoulder ER, abd, and flexio in supine                  PT Short Term Goals - 12/15/14 1035    PT SHORT TERM GOAL #1   Title I with initial HEP ( 12/01/14)   Status Achieved   PT SHORT TERM GOAL #2   Title increase Lt shoulder ER =/> 85 degrees without pain ( 12/01/14)   Status Achieved   PT SHORT TERM GOAL #3   Title demo Lt shoulder strength =/> 4+/5 without pain ( 12/01/14)    Status Achieved    PT SHORT TERM GOAL #4   Title improve FOTO =/< 45% limited ( 12/01/14)   Status Achieved           PT Long Term Goals - 12/28/14 1515    PT LONG TERM GOAL #1   Title I with advanced HEP ( 01/25/15)    Time 4   Period Weeks   Status On-going   PT LONG TERM GOAL #2   Title demo Lt Ue strength =/> 5/5 ( 01/25/15)    Time 4   Period Weeks   Status On-going   PT LONG TERM GOAL #3   Title improve Lt grip =/> 100# ( 12/29/14)    Status Achieved   PT LONG TERM GOAL #4   Title improve FOTO =/< 32% limited (01/25/15)    Time 4   Period Weeks   Status On-going   PT LONG TERM GOAL #5   Title if cleared by MD lift =/> 50# floor to chest level ( 01/25/15)   Time 4   Period Weeks   Status On-going               Plan - 12/28/14 1114    Clinical Impression Statement Pt demo improved Lt shoulder flexion/abduction active ROM. Slight decrease in Lt ER ROM, with reported pain at end range.  Continues to have tightness/tenderness in Lt pec, upper thoracic paraspinal, Lt upper trap and scalenes.  Pt demo improved mobility and decreased tenderness after manual therapy and further reduction of pain with estim/vaso.    Pt will benefit from skilled therapeutic intervention in order to improve on the following deficits Obesity;Impaired UE functional use;Pain;Decreased range of motion;Decreased strength   Rehab Potential Good   PT Frequency 2x / week   PT Duration 8 weeks   PT Treatment/Interventions Moist Heat;Ultrasound;Therapeutic exercise;Taping;Manual techniques;Neuromuscular re-education;Cryotherapy;Electrical Stimulation;Patient/family education;Scar mobilization;Passive range of motion   PT Next Visit Plan Continue progressive strengthening and stretching to Lt shoulder.    Consulted and Agree with Plan of Care Patient        Problem List There are no active problems to display for this patient.   Mayer Camel, PTA 12/28/2014 3:19 PM  Roderic Scarce, PT 12/28/2014  3:19 PM   [facility] [place] [address] [place] [address] [place], [place], [address] Phone: [phone]   Fax:  [phone]

## 2014-12-28 NOTE — Addendum Note (Signed)
Addended by: SHAVER, SUSAN E on: 12/28/2014 03:19 PM   Modules accepted: Orders

## 2014-12-31 ENCOUNTER — Ambulatory Visit (INDEPENDENT_AMBULATORY_CARE_PROVIDER_SITE_OTHER): Payer: Managed Care, Other (non HMO) | Admitting: Physical Therapy

## 2014-12-31 DIAGNOSIS — M25512 Pain in left shoulder: Secondary | ICD-10-CM | POA: Diagnosis not present

## 2014-12-31 DIAGNOSIS — R29898 Other symptoms and signs involving the musculoskeletal system: Secondary | ICD-10-CM | POA: Diagnosis not present

## 2014-12-31 DIAGNOSIS — M25612 Stiffness of left shoulder, not elsewhere classified: Secondary | ICD-10-CM

## 2014-12-31 NOTE — Therapy (Addendum)
[facility] [place] [address] [place], [place], [address] Phone: [phone]   Fax:  [phone]  Physical Therapy Treatment  Patient Details  Name: Victor Brown MRN: [id number] Date of Birth: 1965-06-12 Referring Provider:  Marcene Corning, MD  Encounter Date: 12/31/2014      PT End of Session - 12/31/14 1018    Visit Number 18   Number of Visits 24   Date for PT Re-Evaluation 01/25/15   PT Start Time 1014   PT Stop Time 1117   PT Time Calculation (min) 63 min      Past Medical History  Diagnosis Date  . Sleep apnea     uses cpap    Past Surgical History  Procedure Laterality Date  . Back surgery  1999  . Rotator cuff repair Right 2002  . Rotator cuff repair Left 2008  . Carpal tunnel release N/A 2014  . Trigger finger release Bilateral 2001-2016    multiple  . Knee arthroplasty Bilateral 2005  . Elbow debridement Right 2002  . Mandible surgery N/A 1986  . Trigger finger release Left 08/03/2014    Procedure: LEFT LONG FINGER TRIGGER RELEASE;  Surgeon: Mack Hook, MD;  Location: [facility];  Service: Orthopedics;  Laterality: Left;    There were no vitals filed for this visit.  Visit Diagnosis:  Pain in joint, shoulder region, left  Upper limb weakness  Stiffness of shoulder joint, left      Subjective Assessment - 12/31/14 1018    Subjective Pt reports he was sore when he first woke up, was sore until after the shoulder.    Currently in Pain? Yes   Pain Score 3    Pain Location Shoulder   Pain Orientation Left   Pain Descriptors / Indicators Aching  muscular feeling                         [facility] Adult PT Treatment/Exercise - 12/31/14 0001    Exercises   Exercises Elbow   Shoulder Exercises: Standing   External Rotation Left  rolling ball with arm abd 90 degrees ball behind hand   Other Standing Exercises with noodle behind back on noodle, arms flexed  90degrees rolling 2.5# wt up/down 4x5   Other Standing Exercises cone stacking 2x3' with bilat UE above shoulder height 1.5# on Lt UE   Shoulder Exercises: ROM/Strengthening   UBE (Upper Arm Bike) L8 x 8 min (alternating each direction). Standing.    Other ROM/Strengthening Exercises FWD hold with 10# x15   Other ROM/Strengthening Exercises push/pull sled building form 65# - 120#    Modalities   Modalities Electrical Stimulation;Vasopneumatic   Electrical Stimulation   Electrical Stimulation Location Lt shoulder, thoracic paraspinals    Electrical Stimulation Action IFC   Electrical Stimulation Parameters to tolerance   Electrical Stimulation Goals Pain   Vasopneumatic   Number Minutes Vasopneumatic  15 minutes   Vasopnuematic Location  Shoulder   Vasopneumatic Pressure Medium   Vasopneumatic Temperature  3*                  PT Short Term Goals - 12/15/14 1035    PT SHORT TERM GOAL #1   Title I with initial HEP ( 12/01/14)   Status Achieved   PT SHORT TERM GOAL #2   Title increase Lt shoulder ER =/> 85 degrees without pain ( 12/01/14)   Status Achieved   PT SHORT TERM GOAL #3  Title demo Lt shoulder strength =/> 4+/5 without pain ( 12/01/14)    Status Achieved   PT SHORT TERM GOAL #4   Title improve FOTO =/< 45% limited ( 12/01/14)   Status Achieved           PT Long Term Goals - 12/28/14 1515    PT LONG TERM GOAL #1   Title I with advanced HEP ( 01/25/15)    Time 4   Period Weeks   Status On-going   PT LONG TERM GOAL #2   Title demo Lt Ue strength =/> 5/5 ( 01/25/15)    Time 4   Period Weeks   Status On-going   PT LONG TERM GOAL #3   Title improve Lt grip =/> 100# ( 12/29/14)    Status Achieved   PT LONG TERM GOAL #4   Title improve FOTO =/< 32% limited (01/25/15)    Time 4   Period Weeks   Status On-going   PT LONG TERM GOAL #5   Title if cleared by MD lift =/> 50# floor to chest level ( 01/25/15)   Time 4   Period Weeks   Status On-going                Plan - 12/31/14 1047    Clinical Impression Statement Pt able to perform higher level and harder exercises with minimal pain.Tolerance to holding his arms up is improving.    Pt will benefit from skilled therapeutic intervention in order to improve on the following deficits Obesity;Impaired UE functional use;Pain;Decreased range of motion;Decreased strength   Rehab Potential Good   PT Frequency 2x / week   PT Duration 8 weeks   PT Treatment/Interventions Moist Heat;Ultrasound;Therapeutic exercise;Taping;Manual techniques;Neuromuscular re-education;Cryotherapy;Electrical Stimulation;Patient/family education;Scar mobilization;Passive range of motion   PT Next Visit Plan progress work stimulation   Consulted and Agree with Plan of Care Patient        Problem List There are no active problems to display for this patient.   Roderic Scarce PT 12/31/2014, 6:02 PM  [facility] [address] [place], [place], [address] Phone: [phone]   Fax:  [phone]

## 2015-01-05 ENCOUNTER — Ambulatory Visit (INDEPENDENT_AMBULATORY_CARE_PROVIDER_SITE_OTHER): Payer: Managed Care, Other (non HMO) | Admitting: Physical Therapy

## 2015-01-05 ENCOUNTER — Encounter: Payer: Self-pay | Admitting: Physical Therapy

## 2015-01-05 DIAGNOSIS — M25512 Pain in left shoulder: Secondary | ICD-10-CM | POA: Diagnosis not present

## 2015-01-05 DIAGNOSIS — R29898 Other symptoms and signs involving the musculoskeletal system: Secondary | ICD-10-CM

## 2015-01-05 DIAGNOSIS — M25612 Stiffness of left shoulder, not elsewhere classified: Secondary | ICD-10-CM

## 2015-01-05 NOTE — Therapy (Addendum)
[place], [place], [address] Phone: [phone]   Fax:  [phone]  Physical Therapy Treatment  Patient Details  Name: Victor Brown MRN: [id number] Date of Birth: [birth year] Referring Provider:  Melrose Nakayama, MD  Encounter Date: 01/05/2015      PT End of Session - 01/05/15 1016    Visit Number 19   Number of Visits 24   Date for PT Re-Evaluation 01/25/15   PT Start Time 1022   PT Stop Time 1112   PT Time Calculation (min) 50 min      Past Medical History  Diagnosis Date  . Sleep apnea     uses cpap    Past Surgical History  Procedure Laterality Date  . Back surgery  1999  . Rotator cuff repair Right 2002  . Rotator cuff repair Left 2008  . Carpal tunnel release N/A 2014  . Trigger finger release Bilateral 2001-2016    multiple  . Knee arthroplasty Bilateral 2005  . Elbow debridement Right 2002  . Mandible surgery N/A 1986  . Trigger finger release Left 08/03/2014    Procedure: LEFT LONG FINGER TRIGGER RELEASE;  Surgeon: Milly Jakob, MD;  Location: [place];  Service: Orthopedics;  Laterality: Left;    There were no vitals filed for this visit.  Visit Diagnosis:  Pain in joint, shoulder region, left  Upper limb weakness  Stiffness of shoulder joint, left      Subjective Assessment - 01/05/15 1047    Subjective Pt goes to see the MD tomorrow, getting stronger, no real change in pain in the shoulder, AM's worse than PM's   Currently in Pain? Yes   Pain Score 3    Pain Location Shoulder   Pain Orientation Left   Aggravating Factors  strengthening   Pain Relieving Factors rest, ice            [facility] PT Assessment - 01/05/15 0001    Assessment   Medical Diagnosis Lt shoulder AC resection & debridement   Onset Date/Surgical Date 10/22/14   Hand Dominance Left   Next MD Visit 01/06/15   Prior Function   Level of Independence Independent   Observation/Other Assessments   Focus on Therapeutic Outcomes (FOTO)  33% limited   ROM / Strength   AROM / PROM / Strength AROM;Strength   AROM   AROM Assessment Site Shoulder   Right/Left Shoulder Left   Left Shoulder Flexion 178 Degrees   Left Shoulder ABduction 178 Degrees   Left Shoulder Internal Rotation 88 Degrees   Left Shoulder External Rotation 88 Degrees   Strength   Strength Assessment Site Shoulder   Right/Left Shoulder Left   Left Shoulder Flexion 5/5   Left Shoulder Extension 5/5   Left Shoulder ABduction 5/5   Left Shoulder Internal Rotation 5/5   Left Shoulder External Rotation --  5-/5   Right/Left hand Left   Left Hand Grip (lbs) 122#                     [facility] Adult PT Treatment/Exercise - 01/05/15 0001    Shoulder Exercises: Prone   Other Prone Exercises plank walking hands up/down 6" step x5    Shoulder Exercises: Standing   Horizontal ABduction Left;20 reps;Theraband   Theraband Level (Shoulder Horizontal ABduction) --  black band, L5   Other Standing Exercises drawing the sword, black band Lt UE x20   Shoulder Exercises: ROM/Strengthening   UBE (Upper  Arm Bike) L8 x 8 min (alternating each direction). Standing.    Other ROM/Strengthening Exercises lifting box 50# floor to waist,   Other ROM/Strengthening Exercises Lt UE lifting 50#., FWD punch Lt UE 3 plates   Vasopneumatic   Number Minutes Vasopneumatic  15 minutes   Vasopnuematic Location  Shoulder   Vasopneumatic Pressure Medium   Vasopneumatic Temperature  3*                  PT Short Term Goals - 01/05/15 1025    PT SHORT TERM GOAL #1   Title I with initial HEP ( 12/01/14)   Status Achieved   PT SHORT TERM GOAL #2   Title increase Lt shoulder ER =/> 85 degrees without pain ( 12/01/14)   Status Achieved   PT SHORT TERM GOAL #3   Status Achieved   PT SHORT TERM GOAL #4   Title improve FOTO =/< 45% limited ( 12/01/14)   Status Achieved           PT Long Term  Goals - 01/05/15 1025    PT LONG TERM GOAL #1   Title I with advanced HEP ( 01/25/15)    Status Achieved   PT LONG TERM GOAL #2   Title demo Lt Ue strength =/> 5/5 ( 01/25/15)    Status Partially Met  al movement but ER 5-/5   PT LONG TERM GOAL #3   Title improve Lt grip =/> 100# ( 12/29/14)    Status Achieved   PT LONG TERM GOAL #4   Title improve FOTO =/< 32% limited (01/25/15)    Status Not Met  scored 33% limited   PT LONG TERM GOAL #5   Title if cleared by MD lift =/> 50# floor to chest level ( 01/25/15)   Status Achieved  able to lift up to 63# single arm Lt               Plan - 01/05/15 1049    Clinical Impression Statement Pt doing well, has met almost all his goals.  Tolerance to physical activity is down, however this will improve as he returns to his normal activity.  Strength and ROM in Lt shoulder good.    Pt will benefit from skilled therapeutic intervention in order to improve on the following deficits Obesity;Impaired UE functional use;Pain;Decreased range of motion;Decreased strength   Rehab Potential Good   PT Frequency 2x / week   PT Treatment/Interventions Moist Heat;Ultrasound;Therapeutic exercise;Taping;Manual techniques;Neuromuscular re-education;Cryotherapy;Electrical Stimulation;Patient/family education;Scar mobilization;Passive range of motion   PT Next Visit Plan pt doing well, sees MD tomorrow and will see if he can return to work.    Consulted and Agree with Plan of Care Patient        Problem List There are no active problems to display for this patient.   Jeral Pinch PT 01/05/2015, 11:00 AM  [facility] [address] [place], [place], [address] Phone: [phone]   Fax:  [phone]     PHYSICAL THERAPY DISCHARGE SUMMARY  Visits from Start of Care:19  Current functional level related to goals / functional outcomes: See above   Remaining deficits: Endurance for a  full days work   Education / Equipment: HEP Plan: Patient agrees to discharge.  Patient goals were partially met. Patient is being discharged due to  returning to work.  ?????       Jeral Pinch, PT 01/25/2015 4:48 PM

## 2015-01-07 ENCOUNTER — Encounter: Payer: Managed Care, Other (non HMO) | Admitting: Physical Therapy

## 2015-01-13 ENCOUNTER — Encounter: Payer: Managed Care, Other (non HMO) | Admitting: Physical Therapy

## 2015-01-15 ENCOUNTER — Encounter: Payer: Managed Care, Other (non HMO) | Admitting: Physical Therapy

## 2015-06-09 ENCOUNTER — Other Ambulatory Visit: Payer: Self-pay | Admitting: Orthopaedic Surgery

## 2015-06-14 NOTE — H&P (Signed)
Victor RuddyMichael S Brown is an 50 y.o. male.   Chief Complaint: Right trigger thumb and pain HPI: Victor NeedleMichael has a new problem with the right thumb triggering I think he has mentioned in on a previous visit.  He has not had injections in the past on this thumb because all of the other trigger fingers that he has had injected were minimally to non-beneficial.  It is triggering significantly causing trouble with work and activities of daily living or like to have it released surgically as this has been the only effective treatment for other ones previous.  Past Medical History  Diagnosis Date  . Sleep apnea     uses cpap    Past Surgical History  Procedure Laterality Date  . Back surgery  1999  . Rotator cuff repair Right 2002  . Rotator cuff repair Left 2008  . Carpal tunnel release N/A 2014  . Trigger finger release Bilateral 2001-2016    multiple  . Knee arthroplasty Bilateral 2005  . Elbow debridement Right 2002  . Mandible surgery N/A 1986  . Trigger finger release Left 08/03/2014    Procedure: LEFT LONG FINGER TRIGGER RELEASE;  Surgeon: Mack Hookavid Thompson, MD;  Location: [facility];  Service: Orthopedics;  Laterality: Left;    No family history on file. Social History:  reports that he has never smoked. He has never used smokeless tobacco. He reports that he does not drink alcohol or use illicit drugs.  Allergies: No Known Allergies  No prescriptions prior to admission    No results found for this or any previous visit (from the past 48 hour(s)). No results found.  Review of Systems  Musculoskeletal: Positive for joint pain.       Right thumb  All other systems reviewed and are negative.   There were no vitals taken for this visit. Physical Exam  Constitutional: He is oriented to person, place, and time. He appears well-developed and well-nourished.  HENT:  Head: Normocephalic and atraumatic.  Eyes: Pupils are equal, round, and reactive to light.  Neck: Normal range  of motion.  Cardiovascular: Normal rate and regular rhythm.   Respiratory: Effort normal.  GI: Soft.  Musculoskeletal:  Examination of his right hand and thumb.  In particular.  He has a thumb that we will trigger and lock repeatedly.  Thenar eminences normal sensory motor function normal.  Other fingers do not lock and function.  Full motion.  Wrist and elbow shoulder motion full and pain-free.    Neurological: He is alert and oriented to person, place, and time.  Skin: Skin is warm and dry.  Psychiatric: He has a normal mood and affect. His behavior is normal. Judgment and thought content normal.     Assessment/Plan Assessment: Right trigger thumb.  Plan: At this point we have recommended surgical release of the right trigger finger-thumb. We talked about the risk of anesthesia, infection and postoperative care.   He is aware and agrees with this. He would like to proceed with surgery.  Daeshaun Specht, Victor OrganNDREW PAUL, PA-C 06/14/2015, 9:17 AM

## 2015-06-17 NOTE — Pre-Procedure Instructions (Signed)
Bertis RuddyMichael S Gotts  06/17/2015      [facility] - [place], [place] - [address] [place] [address] Phone: [phone] Fax: [phone]    Your procedure is scheduled on Tuesday, June 22, 2015  Report to [facility] Admitting at 8:15 A.M.  Call this number if you have problems the morning of surgery:  [phone]   Remember:  Do not eat food or drink liquids after midnight Monday, June 21, 2015  Take these medicines the morning of surgery with A SIP OF WATER : None Stop taking Aspirin, vitamins, fish oil, and herbal medications. Do not take any NSAIDs ie: Ibuprofen, Advil, Naproxen, BC and Goody Powder, or any medication containing Aspirin; stop now.   Do not wear jewelry     Do not wear lotions, powders, or perfumes.  You may not wear deodorant.     Men may shave face and neck.   Do not bring valuables to the hospital.   [facility] is not responsible for any belongings or valuables.  Contacts, dentures or bridgework may not be worn into surgery.  Leave your suitcase in the car.  After surgery it may be brought to your room.  For patients admitted to the hospital, discharge time will be determined by your treatment team.  Patients discharged the day of surgery will not be allowed to drive home.   Name and phone number of your driver: with parent   Special instructions:Special Instructions: [facility] - Preparing for Surgery  Before surgery, you can play an important role.  Because skin is not sterile, your skin needs to be as free of germs as possible.  You can reduce the number of germs on you skin by washing with CHG (chlorahexidine gluconate) soap before surgery.  CHG is an antiseptic cleaner which kills germs and bonds with the skin to continue killing germs even after washing.  Please DO NOT use if you have an allergy to CHG or antibacterial soaps.  If your skin becomes reddened/irritated stop using the CHG and inform  your nurse when you arrive at Short Stay.  Do not shave (including legs and underarms) for at least 48 hours prior to the first CHG shower.  You may shave your face.  Please follow these instructions carefully:   1.  Shower with CHG Soap the night before surgery and the  morning of Surgery.  2.  If you choose to wash your hair, wash your hair first as usual with your  normal shampoo.  3.  After you shampoo, rinse your hair and body thoroughly to remove the  Shampoo.  4.  Use CHG as you would any other liquid soap.  You can apply chg directly to the skin and wash gently with scrungie or a clean washcloth.  5.  Apply the CHG Soap to your body ONLY FROM THE NECK DOWN.    Do not use on open wounds or open sores.  Avoid contact with your eyes, ears, mouth and genitals (private parts).  Wash genitals (private parts)   with your normal soap.  6.  Wash thoroughly, paying special attention to the area where your surgery will be performed.  7.  Thoroughly rinse your body with warm water from the neck down.  8.  DO NOT shower/wash with your normal soap after using and rinsing off   the CHG Soap.  9.  Pat yourself dry with a clean towel.  10.  Wear clean pajamas.            11.  Place clean sheets on your bed the night of your first shower and do not sleep with pets.  Day of Surgery  Do not apply any lotions/deodorants the morning of surgery.  Please wear clean clothes to the hospital/surgery center.  Please read over the following fact sheets that you were given. Pain Booklet, Coughing and Deep Breathing and Surgical Site Infection Prevention

## 2015-06-18 ENCOUNTER — Encounter (HOSPITAL_COMMUNITY): Payer: Self-pay

## 2015-06-18 ENCOUNTER — Encounter (HOSPITAL_COMMUNITY)
Admission: RE | Admit: 2015-06-18 | Discharge: 2015-06-18 | Disposition: A | Discharge status: Home / Self Care | Payer: Managed Care, Other (non HMO) | Source: Ambulatory Visit | Attending: Orthopaedic Surgery | Admitting: Orthopaedic Surgery

## 2015-06-18 DIAGNOSIS — M65311 Trigger thumb, right thumb: Secondary | ICD-10-CM | POA: Diagnosis not present

## 2015-06-18 DIAGNOSIS — Z01812 Encounter for preprocedural laboratory examination: Secondary | ICD-10-CM | POA: Insufficient documentation

## 2015-06-18 HISTORY — DX: Myoneural disorder, unspecified: G70.9

## 2015-06-18 LAB — BASIC METABOLIC PANEL
Anion gap: 12 (ref 5–15)
BUN: 11 mg/dL (ref 6–20)
CO2: 22 mmol/L (ref 22–32)
CREATININE: 1.04 mg/dL (ref 0.61–1.24)
Calcium: 9.3 mg/dL (ref 8.9–10.3)
Chloride: 106 mmol/L (ref 101–111)
GFR calc Af Amer: >60 mL/min (ref >60)
GLUCOSE: 122 mg/dL — AB (ref 65–99)
POTASSIUM: 3.9 mmol/L (ref 3.5–5.1)
SODIUM: 140 mmol/L (ref 135–145)

## 2015-06-18 LAB — CBC
HEMATOCRIT: 39.7 % (ref 39.0–52.0)
Hemoglobin: 13.9 g/dL (ref 13.0–17.0)
MCH: 30.5 pg (ref 26.0–34.0)
MCHC: 35 g/dL (ref 30.0–36.0)
MCV: 87.3 fL (ref 78.0–100.0)
PLATELETS: 195 10*3/uL (ref 150–400)
RBC: 4.55 MIL/uL (ref 4.22–5.81)
RDW: 12.4 % (ref 11.5–15.5)
WBC: 5.9 10*3/uL (ref 4.0–10.5)

## 2015-06-18 NOTE — Progress Notes (Signed)
Pt. Voicing great annoyance with having to come here to hosp. [facility] for surg. & especially for PAT visit. Pt. Assured that we are aiming for a safe outcome & that we are arranging things to be done for his best interest. Pt. Denies having PCP, denies taking any meds., but then reveals that he took Mobic for a short itme but it didn't help the hand. Revealed in Careeverywhere he had a resp. Infection/virus last month, seen in [place] [facility], tx. with steroid, antibiotic- for which he finished both.  Pt. Denies chest concerns, reports the virus is resolved. Pt. On CPAP every night, 15 pressure setting. Pt. States he had a stress test in 2008- or- 2010, pt. Is not sure where or why it was done.

## 2015-06-21 MED ORDER — LACTATED RINGERS IV SOLN
INTRAVENOUS | Status: DC
  Administered 2015-06-22: 09:00:00 via INTRAVENOUS

## 2015-06-21 MED ORDER — DEXTROSE 5 % IV SOLN
3.0000 g | INTRAVENOUS | Status: AC
  Administered 2015-06-22: 12:00:00 via INTRAVENOUS
  Administered 2015-06-22: 3 g via INTRAVENOUS
  Filled 2015-06-21: qty 3000

## 2015-06-22 ENCOUNTER — Ambulatory Visit (HOSPITAL_COMMUNITY)
Admission: RE | Admit: 2015-06-22 | Discharge: 2015-06-22 | Disposition: A | Discharge status: Home / Self Care | Payer: Managed Care, Other (non HMO) | Source: Ambulatory Visit | Attending: Orthopaedic Surgery | Admitting: Orthopaedic Surgery

## 2015-06-22 ENCOUNTER — Encounter (HOSPITAL_COMMUNITY)
Admission: RE | Disposition: A | Discharge status: Home / Self Care | Payer: Self-pay | Source: Ambulatory Visit | Attending: Orthopaedic Surgery

## 2015-06-22 ENCOUNTER — Ambulatory Visit (HOSPITAL_COMMUNITY): Payer: Managed Care, Other (non HMO) | Admitting: Anesthesiology

## 2015-06-22 ENCOUNTER — Encounter (HOSPITAL_COMMUNITY): Payer: Self-pay | Admitting: *Deleted

## 2015-06-22 DIAGNOSIS — M65311 Trigger thumb, right thumb: Secondary | ICD-10-CM | POA: Insufficient documentation

## 2015-06-22 DIAGNOSIS — G4730 Sleep apnea, unspecified: Secondary | ICD-10-CM | POA: Insufficient documentation

## 2015-06-22 HISTORY — PX: TRIGGER FINGER RELEASE: SHX641

## 2015-06-22 SURGERY — RELEASE, A1 PULLEY, FOR TRIGGER FINGER
Anesthesia: General | Site: Thumb | Laterality: Right

## 2015-06-22 MED ORDER — PROPOFOL 10 MG/ML IV BOLUS
INTRAVENOUS | Status: DC | PRN
  Administered 2015-06-22: 200 mg via INTRAVENOUS

## 2015-06-22 MED ORDER — PROMETHAZINE HCL 25 MG/ML IJ SOLN: 6.2500 mg | INTRAMUSCULAR | Status: DC | PRN

## 2015-06-22 MED ORDER — PROPOFOL 10 MG/ML IV BOLUS
INTRAVENOUS | Status: AC
  Filled 2015-06-22: qty 20

## 2015-06-22 MED ORDER — MIDAZOLAM HCL 2 MG/2ML IJ SOLN
INTRAMUSCULAR | Status: AC
  Filled 2015-06-22: qty 2

## 2015-06-22 MED ORDER — FENTANYL CITRATE (PF) 250 MCG/5ML IJ SOLN
INTRAMUSCULAR | Status: AC
  Filled 2015-06-22: qty 5

## 2015-06-22 MED ORDER — PROPOFOL 500 MG/50ML IV EMUL
INTRAVENOUS | Status: DC | PRN
  Administered 2015-06-22: 25 ug/kg/min via INTRAVENOUS

## 2015-06-22 MED ORDER — HYDROCODONE-ACETAMINOPHEN 5-325 MG PO TABS: 1.0000 | ORAL_TABLET | ORAL | Status: DC | PRN

## 2015-06-22 MED ORDER — MIDAZOLAM HCL 5 MG/5ML IJ SOLN
INTRAMUSCULAR | Status: DC | PRN
  Administered 2015-06-22: 2 mg via INTRAVENOUS

## 2015-06-22 MED ORDER — KETOROLAC TROMETHAMINE 30 MG/ML IJ SOLN
30.0000 mg | Freq: Once | INTRAMUSCULAR | Status: AC | PRN
  Administered 2015-06-22: 30 mg via INTRAVENOUS

## 2015-06-22 MED ORDER — FENTANYL CITRATE (PF) 100 MCG/2ML IJ SOLN
INTRAMUSCULAR | Status: DC | PRN
  Administered 2015-06-22 (×3): 50 ug via INTRAVENOUS
  Administered 2015-06-22: 25 ug via INTRAVENOUS
  Administered 2015-06-22: 50 ug via INTRAVENOUS

## 2015-06-22 MED ORDER — LIDOCAINE HCL (PF) 0.5 % IJ SOLN
INTRAMUSCULAR | Status: DC | PRN
  Administered 2015-06-22: 250 mg via INTRAVENOUS

## 2015-06-22 MED ORDER — KETOROLAC TROMETHAMINE 30 MG/ML IJ SOLN
INTRAMUSCULAR | Status: AC
  Filled 2015-06-22: qty 1

## 2015-06-22 MED ORDER — 0.9 % SODIUM CHLORIDE (POUR BTL) OPTIME
TOPICAL | Status: DC | PRN
  Administered 2015-06-22: 1000 mL

## 2015-06-22 MED ORDER — ONDANSETRON HCL 4 MG/2ML IJ SOLN
INTRAMUSCULAR | Status: DC | PRN
  Administered 2015-06-22: 4 mg via INTRAVENOUS

## 2015-06-22 MED ORDER — FENTANYL CITRATE (PF) 100 MCG/2ML IJ SOLN: 25.0000 ug | INTRAMUSCULAR | Status: DC | PRN

## 2015-06-22 SURGICAL SUPPLY — 42 items
BANDAGE ELASTIC 3 VELCRO ST LF (GAUZE/BANDAGES/DRESSINGS) ×4 IMPLANT
BANDAGE ELASTIC 4 VELCRO ST LF (GAUZE/BANDAGES/DRESSINGS) ×3 IMPLANT
BNDG CONFORM 3 STRL LF (GAUZE/BANDAGES/DRESSINGS) ×2 IMPLANT
BNDG GAUZE ELAST 4 BULKY (GAUZE/BANDAGES/DRESSINGS) ×3 IMPLANT
CORDS BIPOLAR (ELECTRODE) ×3 IMPLANT
COVER SURGICAL LIGHT HANDLE (MISCELLANEOUS) ×3 IMPLANT
CUFF TOURNIQUET SINGLE 18IN (TOURNIQUET CUFF) ×3 IMPLANT
CUFF TOURNIQUET SINGLE 24IN (TOURNIQUET CUFF) IMPLANT
DRAPE SURG 17X23 STRL (DRAPES) ×3 IMPLANT
EVACUATOR 1/8 PVC DRAIN (DRAIN) IMPLANT
GAUZE SPONGE 4X4 12PLY STRL (GAUZE/BANDAGES/DRESSINGS) ×3 IMPLANT
GAUZE XEROFORM 1X8 LF (GAUZE/BANDAGES/DRESSINGS) ×3 IMPLANT
GLOVE BIO SURGEON STRL SZ8 (GLOVE) ×12 IMPLANT
GLOVE BIOGEL PI IND STRL 8 (GLOVE) ×1 IMPLANT
GLOVE BIOGEL PI INDICATOR 8 (GLOVE) ×2
GOWN STRL REUS W/ TWL LRG LVL3 (GOWN DISPOSABLE) ×2 IMPLANT
GOWN STRL REUS W/ TWL XL LVL3 (GOWN DISPOSABLE) ×3 IMPLANT
GOWN STRL REUS W/TWL 2XL LVL3 (GOWN DISPOSABLE) ×1 IMPLANT
GOWN STRL REUS W/TWL LRG LVL3 (GOWN DISPOSABLE) ×6
GOWN STRL REUS W/TWL XL LVL3 (GOWN DISPOSABLE) ×9
KIT BASIN OR (CUSTOM PROCEDURE TRAY) ×3 IMPLANT
KIT ROOM TURNOVER OR (KITS) ×3 IMPLANT
LOOP VESSEL MAXI BLUE (MISCELLANEOUS) IMPLANT
NDL HYPO 25GX1X1/2 BEV (NEEDLE) IMPLANT
NEEDLE HYPO 25GX1X1/2 BEV (NEEDLE) IMPLANT
NS IRRIG 1000ML POUR BTL (IV SOLUTION) ×3 IMPLANT
PACK ORTHO EXTREMITY (CUSTOM PROCEDURE TRAY) ×3 IMPLANT
PAD ARMBOARD 7.5X6 YLW CONV (MISCELLANEOUS) ×6 IMPLANT
PAD CAST 4YDX4 CTTN HI CHSV (CAST SUPPLIES) ×2 IMPLANT
PADDING CAST COTTON 4X4 STRL (CAST SUPPLIES) ×6
SPONGE GAUZE 4X4 12PLY STER LF (GAUZE/BANDAGES/DRESSINGS) ×2 IMPLANT
SUCTION FRAZIER HANDLE 10FR (MISCELLANEOUS)
SUCTION TUBE FRAZIER 10FR DISP (MISCELLANEOUS) IMPLANT
SUT ETHILON 3 0 PS 1 (SUTURE) ×2 IMPLANT
SYR CONTROL 10ML LL (SYRINGE) IMPLANT
SYSTEM CHEST DRAIN TLS 7FR (DRAIN) IMPLANT
TOWEL OR 17X24 6PK STRL BLUE (TOWEL DISPOSABLE) ×3 IMPLANT
TOWEL OR 17X26 10 PK STRL BLUE (TOWEL DISPOSABLE) ×3 IMPLANT
TUBE CONNECTING 12'X1/4 (SUCTIONS)
TUBE CONNECTING 12X1/4 (SUCTIONS) IMPLANT
UNDERPAD 30X30 INCONTINENT (UNDERPADS AND DIAPERS) ×3 IMPLANT
WATER STERILE IRR 1000ML POUR (IV SOLUTION) ×3 IMPLANT

## 2015-06-22 NOTE — Anesthesia Postprocedure Evaluation (Signed)
Anesthesia Post Note  Patient: Victor RuddyMichael S Brown  Procedure(s) Performed: Procedure(s) (LRB): RELEASE TRIGGER FINGER/A-1 PULLEY (Right)  Patient location during evaluation: PACU Anesthesia Type: General Level of consciousness: awake and alert Pain management: pain level controlled Vital Signs Assessment: post-procedure vital signs reviewed and stable Respiratory status: spontaneous breathing, nonlabored ventilation, respiratory function stable and patient connected to nasal cannula oxygen Cardiovascular status: blood pressure returned to baseline and stable Postop Assessment: no signs of nausea or vomiting Anesthetic complications: no    Last Vitals:  Filed Vitals:   06/22/15 0828 06/22/15 1247  BP: 131/75 135/89  Pulse: 72 70  Temp: 36.5 C 36.5 C  Resp: 20 16    Last Pain:  Filed Vitals:   06/22/15 1252  PainSc: 3                  Marisol Giambra S

## 2015-06-22 NOTE — Op Note (Signed)
#  [id number] 

## 2015-06-22 NOTE — Interval H&P Note (Signed)
OK for surgery PD 

## 2015-06-22 NOTE — Transfer of Care (Signed)
Immediate Anesthesia Transfer of Care Note  Patient: Victor Brown  Procedure(s) Performed: Procedure(s) with comments: RELEASE TRIGGER FINGER/A-1 PULLEY (Right) - Bier Block Anesthesia  Patient Location: PACU  Anesthesia Type:General  Level of Consciousness: awake, alert  and oriented  Airway & Oxygen Therapy: Patient Spontanous Breathing and Patient connected to face mask oxygen  Post-op Assessment: Report given to RN and Post -op Vital signs reviewed and stable  Post vital signs: Reviewed and stable  Last Vitals:  Filed Vitals:   06/22/15 0828 06/22/15 1247  BP: 131/75   Pulse: 72 70  Temp: 36.5 C 36.5 C  Resp: 20 16    Complications: No apparent anesthesia complications

## 2015-06-22 NOTE — Anesthesia Preprocedure Evaluation (Addendum)
Anesthesia Evaluation  Patient identified by MRN, date of birth, ID band Patient awake    Reviewed: Allergy & Precautions, NPO status , Patient's Chart, lab work & pertinent test results  Airway Mallampati: III  TM Distance: >3 FB Neck ROM: Full    Dental no notable dental hx. (+) Teeth Intact, Dental Advisory Given   Pulmonary sleep apnea and Continuous Positive Airway Pressure Ventilation ,    Pulmonary exam normal breath sounds clear to auscultation       Cardiovascular negative cardio ROS Normal cardiovascular exam Rhythm:Regular Rate:Normal     Neuro/Psych negative neurological ROS  negative psych ROS   GI/Hepatic negative GI ROS, Neg liver ROS,   Endo/Other  Morbid obesity  Renal/GU negative Renal ROS  negative genitourinary   Musculoskeletal negative musculoskeletal ROS (+)   Abdominal   Peds negative pediatric ROS (+)  Hematology negative hematology ROS (+)   Anesthesia Other Findings   Reproductive/Obstetrics negative OB ROS                            Anesthesia Physical Anesthesia Plan  ASA: III  Anesthesia Plan: MAC and Bier Block   Post-op Pain Management:    Induction: Intravenous  Airway Management Planned: Simple Face Mask  Additional Equipment:   Intra-op Plan:   Post-operative Plan: Extubation in OR  Informed Consent: I have reviewed the patients History and Physical, chart, labs and discussed the procedure including the risks, benefits and alternatives for the proposed anesthesia with the patient or authorized representative who has indicated his/her understanding and acceptance.   Dental advisory given  Plan Discussed with: CRNA and Surgeon  Anesthesia Plan Comments:        Anesthesia Quick Evaluation

## 2015-06-22 NOTE — Anesthesia Procedure Notes (Signed)
Procedure Name: LMA Insertion Date/Time: 06/22/2015 12:31 PM Performed by: Daiva EvesAVENEL, Reneshia Zuccaro W Pre-anesthesia Checklist: Patient identified, Emergency Drugs available, Suction available, Patient being monitored and Timeout performed Patient Re-evaluated:Patient Re-evaluated prior to inductionOxygen Delivery Method: Circle system utilized Preoxygenation: Pre-oxygenation with 100% oxygen Intubation Type: IV induction LMA: LMA inserted LMA Size: 5.0 Number of attempts: 1 Placement Confirmation: positive ETCO2,  CO2 detector and breath sounds checked- equal and bilateral Tube secured with: Tape Dental Injury: Teeth and Oropharynx as per pre-operative assessment

## 2015-06-23 ENCOUNTER — Encounter (HOSPITAL_COMMUNITY): Payer: Self-pay | Admitting: Orthopaedic Surgery

## 2015-06-23 NOTE — Op Note (Signed)
NAME:  Victor Brown, Victor Brown                ACCOUNT NO.:  [id number]  MEDICAL RECORD NO.:  [id number]  LOCATION:  SDS                          FACILITY:  [facility]  PHYSICIAN:  Aroush Chasse G. Vaniya Augspurger, M.D.DATE OF BIRTH:  11/21/1965  DATE OF PROCEDURE:  06/22/2015 DATE OF DISCHARGE:  06/22/2015                              OPERATIVE REPORT   PREOPERATIVE DIAGNOSIS:  Right trigger thumb.  POSTOPERATIVE DIAGNOSIS:  Right trigger thumb.  PROCEDURE:  Right trigger thumb release.  ANESTHESIA:  Bier block and general.  ATTENDING SURGEON:  Johana Hopkinson G. Ahriyah Vannest, MD.  ASSISTANT:  Andrew Nida, PA.  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  At this point, he has a right trigger thumb, which has become recalcitrant to injection and rest.  This has become a bit disabling and is offered a trigger thumb release.  Informed operative consent was obtained after discussion of possible complications including reaction to anesthesia, infection, neurovascular injury.  SUMMARY OF FINDINGS AND PROCEDURE:  Under failed Bier block followed by general anesthetic, a right trigger thumb release was performed.  He had a very thick A1 pulley, which I released.  He did have a bit of a split in his flexor tendon, but no sutures were required.  He was closed primarily and discharged home.  DESCRIPTION OF PROCEDURE:  The patient was taken to the operating suite where Bier block was attempted.  This failed and he subsequently underwent a general anesthetic.  He was positioned supine and prepped and draped in normal sterile fashion.  After the administration of preop IV Kefzol and an appropriate time out, a small incision was made at the MCP flexion crease of the thumb.  Dissection was carried down to the flexor tendon sheath.  This was then incised in the area of the A1 pulley.  Dissection was taken proximally and distally.  This was a very stenotic  area.  The tendon was then pulled out into the wound.  I could find a small longitudinal split in the tendon and evidence of some cortisone preparation in the area.  The wound was irrigated followed by reapproximation of skin with nylon.  The tourniquet was then deflated. A small amount of bleeding was easily controlled with some pressure.  We then placed some Adaptic followed by dry gauze and loose Ace wrap. Estimated blood loss and intraoperative fluids can be obtained from Anesthesia records as can accurate tourniquet time.  DISPOSITION:  The patient was extubated in the operating room and taken to recovery in stable condition.  Plans were for him to go home same-day and follow up in office closely.  I will contact him by phone tonight.     Ramesha Poster G. Diego Ulbricht, M.D.     PGD/MEDQ  D:  06/22/2015  T:  06/23/2015  Job:  [id number] 

## 2015-06-23 NOTE — Op Note (Deleted)
NAMMaxine Glenn:  Brown, Victor                ACCOUNT NO.:  [id number]  MEDICAL RECORD NO.:  [id number]  LOCATION:  SDS                          FACILITY:  [facility]  PHYSICIAN:  Lubertha Basqueeter G. Mercy Leppla, M.D.DATE OF BIRTH:  03-26-1966  DATE OF PROCEDURE:  06/22/2015 DATE OF DISCHARGE:  06/22/2015                              OPERATIVE REPORT   PREOPERATIVE DIAGNOSIS:  Right trigger thumb.  POSTOPERATIVE DIAGNOSIS:  Right trigger thumb.  PROCEDURE:  Right trigger thumb release.  ANESTHESIA:  Bier block and general.  ATTENDING SURGEON:  Lubertha BasquePeter G. Jerl Santosalldorf, MD.  ASSISTANT:  Elodia FlorenceAndrew Nida, PA.  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  At this point, he has a right trigger thumb, which has become recalcitrant to injection and rest.  This has become a bit disabling and is offered a trigger thumb release.  Informed operative consent was obtained after discussion of possible complications including reaction to anesthesia, infection, neurovascular injury.  SUMMARY OF FINDINGS AND PROCEDURE:  Under failed Bier block followed by general anesthetic, a right trigger thumb release was performed.  He had a very thick A1 pulley, which I released.  He did have a bit of a split in his flexor tendon, but no sutures were required.  He was closed primarily and discharged home.  DESCRIPTION OF PROCEDURE:  The patient was taken to the operating suite where Bier block was attempted.  This failed and he subsequently underwent a general anesthetic.  He was positioned supine and prepped and draped in normal sterile fashion.  After the administration of preop IV Kefzol and an appropriate time out, a small incision was made at the MCP flexion crease of the thumb.  Dissection was carried down to the flexor tendon sheath.  This was then incised in the area of the A1 pulley.  Dissection was taken proximally and distally.  This was a very stenotic  area.  The tendon was then pulled out into the wound.  I could find a small longitudinal split in the tendon and evidence of some cortisone preparation in the area.  The wound was irrigated followed by reapproximation of skin with nylon.  The tourniquet was then deflated. A small amount of bleeding was easily controlled with some pressure.  We then placed some Adaptic followed by dry gauze and loose Ace wrap. Estimated blood loss and intraoperative fluids can be obtained from Anesthesia records as can accurate tourniquet time.  DISPOSITION:  The patient was extubated in the operating room and taken to recovery in stable condition.  Plans were for him to go home same-day and follow up in office closely.  I will contact him by phone tonight.     Lubertha BasquePeter G. Jerl Santosalldorf, M.D.     PGD/MEDQ  D:  06/22/2015  T:  06/23/2015  Job:  [id number]

## 2015-12-22 ENCOUNTER — Other Ambulatory Visit: Payer: Self-pay | Admitting: Orthopaedic Surgery

## 2015-12-22 DIAGNOSIS — M25511 Pain in right shoulder: Secondary | ICD-10-CM

## 2016-01-19 ENCOUNTER — Encounter (INDEPENDENT_AMBULATORY_CARE_PROVIDER_SITE_OTHER): Payer: Self-pay

## 2016-01-19 ENCOUNTER — Ambulatory Visit (INDEPENDENT_AMBULATORY_CARE_PROVIDER_SITE_OTHER): Payer: Managed Care, Other (non HMO) | Admitting: Rehabilitative and Restorative Service Providers"

## 2016-01-19 ENCOUNTER — Encounter: Payer: Self-pay | Admitting: Rehabilitative and Restorative Service Providers"

## 2016-01-19 DIAGNOSIS — R29898 Other symptoms and signs involving the musculoskeletal system: Secondary | ICD-10-CM

## 2016-01-19 DIAGNOSIS — M25512 Pain in left shoulder: Secondary | ICD-10-CM

## 2016-01-19 DIAGNOSIS — G8929 Other chronic pain: Secondary | ICD-10-CM

## 2016-01-19 NOTE — Patient Instructions (Signed)
Lying on back   Stretch arm down over edge of bed right beside the bed using a cane in the right hand to push left hand down elbow stays bent  Hold for ~60 sec 3 reps  Arm at 90 deg use cane to push Left arm down toward the floor Hold 60 sec x 3 reps   Trigger Point Dry Needling  . What is Trigger Point Dry Needling (DN)? o DN is a physical therapy technique used to treat muscle pain and dysfunction. Specifically, DN helps deactivate muscle trigger points (muscle knots).  o A thin filiform needle is used to penetrate the skin and stimulate the underlying trigger point. The goal is for a local twitch response (LTR) to occur and for the trigger point to relax. No medication of any kind is injected during the procedure.   . What Does Trigger Point Dry Needling Feel Like?  o The procedure feels different for each individual patient. Some patients report that they do not actually feel the needle enter the skin and overall the process is not painful. Very mild bleeding may occur. However, many patients feel a deep cramping in the muscle in which the needle was inserted. This is the local twitch response.   Marland Kitchen. How Will I feel after the treatment? o Soreness is normal, and the onset of soreness may not occur for a few hours. Typically this soreness does not last longer than two days.  o Bruising is uncommon, however; ice can be used to decrease any possible bruising.  o In rare cases feeling tired or nauseous after the treatment is normal. In addition, your symptoms may get worse before they get better, this period will typically not last longer than 24 hours.   . What Can I do After My Treatment? o Increase your hydration by drinking more water for the next 24 hours. o You may place ice or heat on the areas treated that have become sore, however, do not use heat on inflamed or bruised areas. Heat often brings more relief post needling. o You can continue your regular activities, but vigorous  activity is not recommended initially after the treatment for 24 hours. o DN is best combined with other physical therapy such as strengthening, stretching, and other therapies.

## 2016-01-19 NOTE — Therapy (Signed)
[facility] [place] [address] [place], [place], [address] Phone: [phone]   Fax:  [phone]  Physical Therapy Evaluation  Patient Details  Name: Victor Brown MRN: [id number] Date of Birth: 03/11/1966 Referring Provider: Dr Marcene Corning  Encounter Date: 01/19/2016      PT End of Session - 01/19/16 1519    Visit Number 1   Number of Visits 12   Date for PT Re-Evaluation 03/01/16   PT Start Time 1519   PT Stop Time 1615   PT Time Calculation (min) 56 min   Activity Tolerance Patient tolerated treatment well      Past Medical History:  Diagnosis Date  . 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 Sleep apnea    uses cpap    Past Surgical History:  Procedure Laterality Date  . BACK SURGERY  1999  . CARPAL TUNNEL RELEASE Left 2014  . ELBOW DEBRIDEMENT Right 2002  . KNEE ARTHROSCOPY Bilateral   . MANDIBLE SURGERY N/A 1986  . ROTATOR CUFF REPAIR Right 2002  . ROTATOR CUFF REPAIR Left 2008  . TRIGGER FINGER RELEASE Bilateral 2001-2016   multiple  . TRIGGER FINGER RELEASE Left 08/03/2014   Procedure: LEFT LONG FINGER TRIGGER RELEASE;  Surgeon: Mack Hook, MD;  Location: [facility];  Service: Orthopedics;  Laterality: Left;  . TRIGGER FINGER RELEASE Right 06/22/2015   Procedure: RELEASE TRIGGER FINGER/A-1 PULLEY;  Surgeon: Marcene Corning, MD;  [facility];  Service: Orthopedics;  Laterality: Right;  Bier Block Anesthesia    There were no vitals filed for this visit.       Subjective Assessment - 01/19/16 1529    Subjective Patient initially noticed pain in the Lt shoulder ~ 2 years ago and pain has persisted. He underwent Lt shoulder surgery 10/22/15 with some improvement but pain has persisted and never resolved. Certain postitions increase pain - eg: backpack across shoulder for any period of time; pain and pressure with shoulder elevation which is greater with increased weight;  lying on the Lt side at night; pressure on Lt elbow.    Pertinent History denies any medical or musculoskeletal problems    How long can you sit comfortably? no limit   How long can you stand comfortably? no limit   How long can you walk comfortably? no limit   Diagnostic tests MRI   Patient Stated Goals see if you can help anything   Currently in Pain? Yes   Pain Score 2    Pain Location Shoulder   Pain Orientation Left   Pain Descriptors / Indicators Nagging   Pain Type Chronic pain   Pain Onset More than a month ago   Pain Frequency Intermittent   Aggravating Factors  elevation of arm; lifting overhead; lying on the Lt side; certain activities    Pain Relieving Factors avoiding positions that irritate symptoms; rest             [facility] PT Assessment - 01/19/16 0001      Assessment   Medical Diagnosis Lt shoulder pain    Referring Provider Dr Marcene Corning   Onset Date/Surgical Date 09/08/13  10/22/14   Hand Dominance Left  uses both    Next MD Visit 02/23/16   Prior Therapy yes - last year after surgery      Precautions   Precautions None     Balance Screen   Has the patient fallen in the past 6 months  No   Has the patient had a decrease in activity level because of a fear of falling?  No   Is the patient reluctant to leave their home because of a fear of falling?  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; at [employer] holding equipment overhead and in awkward positions    Leisure sedentary; household chores; eats out      Observation/Other Assessments   Focus on Therapeutic Outcomes (FOTO)  36% limitation      Sensation   Additional Comments intermittent numbness in Lt thumb and index finger      Posture/Postural Control   Posture Comments head forward; shoulders rounded and elevated; increased thoracic kyphosis; scapulae  abducted and rotated along the thoracic wall; UE's in IR at sides      AROM   Right/Left Shoulder --  equal bilat - pain free AROM     Strength   Right/Left Shoulder --  5/5 bilat pain free with resistive testing     Palpation   Palpation comment tender and tight at Rt pecs; upper trap; leveator to deep palpation      Special Tests    Special Tests --  tight lats bilat Lt > Rt                    [facility] Adult PT Treatment/Exercise - 01/19/16 0001      Shoulder Exercises: Supine   Other Supine Exercises lat stretch supine - PT assist 30-45 sec x 3 reps   Other Supine Exercises shoulder ext stretch 30-60 sec x 3; horizontal abduction 30-45 sec x 3 reps      Iontophoresis   Type of Iontophoresis Dexamethasone   Location Lt upper shoulder ~ AC joint area    Dose 120 mAmp dose    Time 12 hr extended wear                 PT Education - 01/19/16 1602    Education provided Yes   Education Details HEP; TDN    Person(s) Educated Patient   Methods Explanation;Demonstration;Tactile cues;Verbal cues;Handout   Comprehension Verbalized understanding;Returned demonstration;Verbal cues required;Tactile cues required             PT Long Term Goals - 01/19/16 1704      PT LONG TERM GOAL #1   Title Decrease tenderness and tightness to palpation through the Lt shoudler 03/01/16   Time 6   Period Weeks   Status New     PT LONG TERM GOAL #2   Title patient reports decreased pain Lt shoudler with functional activities 03/01/16   Time 6   Period Weeks   Status New     PT LONG TERM GOAL #3   Title Independent in HEP 03/01/16   Time 6   Period Weeks   Status New     PT LONG TERM GOAL #4   Title Improve FOTO to </= 28% limitation 03/01/16   Time 6   Period Weeks   Status New     PT LONG TERM GOAL #5   Title Further assessment of Lt shoudler dysfunction as indicated 03/01/16   Time 6   Period Weeks   Status New               Plan - 01/19/16  1655  Clinical Impression Statement Victor Brown presents with continued chronic Lt shoudler pain which has been present for the past 2 years. Patient underwent Lt shoudler surgery 10/22/15 with no change in symptoms. He has poor posture and alignment; significant tightness through the lats Lt > Rt; tenderness and tightness/discomfort Rt upper trap at area of [facility] joint, anterior shoulder at biceps tendon area and insertion of the pecs. Strength and ROM are WNL's and equal bilaterally.    Rehab Potential Good   PT Frequency 2x / week   PT Duration 6 weeks   PT Treatment/Interventions Patient/family education;ADLs/Self Care Home Management;Neuromuscular re-education;Cryotherapy;Electrical Stimulation;Iontophoresis 4mg /ml Dexamethasone;Moist Heat;Ultrasound;Therapeutic activities;Therapeutic exercise;Dry needling;Manual techniques   PT Next Visit Plan trial of TDN Rt shoulder; stretching; continued assessment of Lt shoulder dysfunction    Consulted and Agree with Plan of Care Patient      Patient will benefit from skilled therapeutic intervention in order to improve the following deficits and impairments:  Postural dysfunction, Pain, Increased muscle spasms, Increased fascial restricitons, Decreased activity tolerance  Visit Diagnosis: Chronic left shoulder pain - Plan: PT plan of care cert/re-cert  Other symptoms and signs involving the musculoskeletal system - Plan: PT plan of care cert/re-cert     Problem List There are no active problems to display for this patient.   Dywane Peruski Rober Minion PT, MPH 01/19/2016, 5:09 PM  [facility] [address] [place], [place], [address] Phone: [phone]   Fax:  [phone]  Name: Victor Brown MRN: [id number] Date of Birth: [birth year]

## 2016-01-25 ENCOUNTER — Ambulatory Visit (INDEPENDENT_AMBULATORY_CARE_PROVIDER_SITE_OTHER): Payer: Managed Care, Other (non HMO) | Admitting: Physical Therapy

## 2016-01-25 DIAGNOSIS — G8929 Other chronic pain: Secondary | ICD-10-CM

## 2016-01-25 DIAGNOSIS — M25512 Pain in left shoulder: Secondary | ICD-10-CM

## 2016-01-25 DIAGNOSIS — R29898 Other symptoms and signs involving the musculoskeletal system: Secondary | ICD-10-CM

## 2016-01-25 DIAGNOSIS — M25612 Stiffness of left shoulder, not elsewhere classified: Secondary | ICD-10-CM

## 2016-01-25 NOTE — Therapy (Signed)
[facility] [address], [place], [address] Phone: [phone]   Fax:  [phone]  Physical Therapy Treatment  Patient Details  Name: Victor Brown MRN: [id number] Date of Birth: [birth year] Referring Provider: Dr. Rhona Raider   Encounter Date: 01/25/2016      PT End of Session - 01/25/16 1239    Visit Number 2   Number of Visits 12   Date for PT Re-Evaluation 03/01/16   PT Start Time 1151   PT Stop Time 1249   PT Time Calculation (min) 58 min   Activity Tolerance Patient tolerated treatment well      Past Medical History:  Diagnosis Date  . Neuromuscular disorder ([place])    trigger thumb- R   . Sleep apnea    uses cpap    Past Surgical History:  Procedure Laterality Date  . BACK SURGERY  1999  . CARPAL TUNNEL RELEASE Left 2014  . ELBOW DEBRIDEMENT Right 2002  . KNEE ARTHROSCOPY Bilateral   . MANDIBLE SURGERY N/A 1986  . ROTATOR CUFF REPAIR Right 2002  . ROTATOR CUFF REPAIR Left 2008  . TRIGGER FINGER RELEASE Bilateral 2001-2016   multiple  . TRIGGER FINGER RELEASE Left 08/03/2014   Procedure: LEFT LONG FINGER TRIGGER RELEASE;  Surgeon: Milly Jakob, MD;  Location: [place];  Service: Orthopedics;  Laterality: Left;  . TRIGGER FINGER RELEASE Right 06/22/2015   Procedure: RELEASE TRIGGER FINGER/A-1 PULLEY;  Surgeon: Melrose Nakayama, MD;  Location: [place];  Service: Orthopedics;  Laterality: Right;  Bier Block Anesthesia    There were no vitals filed for this visit.      Subjective Assessment - 01/25/16 1155    Subjective Pt reports he continues to have agrivating pain in top of shoulder when working.  He did have some relief (not as tight) after last visit.     Currently in Pain? Yes   Pain Score 3    Pain Location Shoulder   Pain Orientation Left   Pain Descriptors / Indicators Sore   Aggravating Factors  lying on Lt side, end range flexion, leaning into Lt elbow.    Pain  Relieving Factors avoiding agrivating positions.             [facility] PT Assessment - 01/25/16 0001      Assessment   Medical Diagnosis Lt shoulder pain    Referring Provider Dr. Rhona Raider    Onset Date/Surgical Date 09/08/13  10/22/14   Hand Dominance Left  uses both    Next MD Visit 02/23/16          [facility] Adult PT Treatment/Exercise - 01/25/16 0001      Shoulder Exercises: Supine   Other Supine Exercises shoulder ext stretch 60 sec x 3; horizontal abduction 60 sec x 3 reps      Shoulder Exercises: ROM/Strengthening   UBE (Upper Arm Bike) L4: 2 min forward, 1 min backward.      Shoulder Exercises: Stretch   Other Shoulder Stretches Lat stretch with hands on back of truck x 45 sec x 3 reps     Modalities   Modalities Electrical Stimulation;Vasopneumatic;Iontophoresis     Electrical Stimulation   Electrical Stimulation Location Lt shoulder (inf to clavical, AC joint, lateral shoulder)    Electrical Stimulation Action IFC   Electrical Stimulation Parameters  to tolerance   Electrical Stimulation Goals Pain     Iontophoresis   Type of Iontophoresis Dexamethasone   Location Lt upper shoulder ~ [facility]  joint area    Dose 80 mAmp patch   Time 8 hr      Vasopneumatic   Number Minutes Vasopneumatic  15 minutes   Vasopnuematic Location  Shoulder   Vasopneumatic Pressure Low   Vasopneumatic Temperature  3*     Manual Therapy   Manual Therapy Soft tissue mobilization;Joint mobilization   Joint Mobilization Grade 1 AP mob to Lt [place] jt, 1st rib, 2nd rib.    Soft tissue mobilization Edge tool assistance to Lt ant / superior/ posterior shoulder and pec to decrease fascial restrictions/ pain.    Followed up the manual work to same area.             PT Long Term Goals - 01/25/16 1309      PT LONG TERM GOAL #1   Title Decrease tenderness and tightness to palpation through the Lt shoulder 03/01/16   Time 6   Period Weeks   Status On-going     PT LONG TERM GOAL #2   Title  patient reports decreased pain Lt shoulder with functional activities 03/01/16   Time 6   Period Weeks   Status On-going     PT LONG TERM GOAL #3   Title Independent in HEP 03/01/16   Baseline Grip 91 pounds   Time 6   Period Weeks   Status On-going     PT LONG TERM GOAL #4   Title Improve FOTO to </= 28% limitation 03/01/16   Time 6   Period Weeks   Status On-going     PT LONG TERM GOAL #5   Title Further assessment of Lt shoulder dysfunction as indicated 03/01/16   Time 6   Period Weeks   Status On-going               Plan - 01/25/16 1239    Clinical Impression Statement Pt had tightness and tenderness in musculature around Lt [place] joint/ AC joint and pec minor.  Slightly decreased with manual therapy.   No goals met yet, as this was only 2nd visit.     Rehab Potential Good   PT Frequency 2x / week   PT Duration 6 weeks   PT Treatment/Interventions Patient/family education;ADLs/Self Care Home Management;Neuromuscular re-education;Cryotherapy;Electrical Stimulation;Iontophoresis 6m/ml Dexamethasone;Moist Heat;Ultrasound;Therapeutic activities;Therapeutic exercise;Dry needling;Manual techniques   PT Next Visit Plan Taping trial to Lt shoulder.  Mobs to [place]/ ribs on Lt. Assess response to 2nd ionto patch.     Consulted and Agree with Plan of Care Patient      Patient will benefit from skilled therapeutic intervention in order to improve the following deficits and impairments:  Postural dysfunction, Pain, Increased muscle spasms, Increased fascial restricitons, Decreased activity tolerance  Visit Diagnosis: Chronic left shoulder pain  Other symptoms and signs involving the musculoskeletal system  Upper limb weakness  Stiffness of shoulder joint, left     Problem List There are no active problems to display for this patient.  JKerin Perna PTA 01/25/16 1:10 PM  [facility] [address]Phone: [phone]  Fax:  [phone] Name: Victor SLAGELMRN: [id number] of Birth: 508-15-1967

## 2016-01-27 ENCOUNTER — Encounter: Payer: Managed Care, Other (non HMO) | Admitting: Physical Therapy

## 2016-01-31 ENCOUNTER — Encounter: Payer: Managed Care, Other (non HMO) | Admitting: Rehabilitative and Restorative Service Providers"

## 2016-02-03 ENCOUNTER — Encounter: Payer: Managed Care, Other (non HMO) | Admitting: Physical Therapy

## 2016-02-04 ENCOUNTER — Encounter: Payer: Managed Care, Other (non HMO) | Admitting: Physical Therapy

## 2016-02-07 ENCOUNTER — Ambulatory Visit (INDEPENDENT_AMBULATORY_CARE_PROVIDER_SITE_OTHER): Payer: Managed Care, Other (non HMO) | Admitting: Rehabilitative and Restorative Service Providers"

## 2016-02-07 ENCOUNTER — Encounter: Payer: Self-pay | Admitting: Rehabilitative and Restorative Service Providers"

## 2016-02-07 DIAGNOSIS — G8929 Other chronic pain: Secondary | ICD-10-CM

## 2016-02-07 DIAGNOSIS — M25612 Stiffness of left shoulder, not elsewhere classified: Secondary | ICD-10-CM

## 2016-02-07 DIAGNOSIS — M25512 Pain in left shoulder: Secondary | ICD-10-CM

## 2016-02-07 DIAGNOSIS — R29898 Other symptoms and signs involving the musculoskeletal system: Secondary | ICD-10-CM | POA: Diagnosis not present

## 2016-02-07 NOTE — Therapy (Signed)
[facility] [address] [place], [place], [address] Phone: [phone]   Fax:  [phone]  Physical Therapy Treatment  Patient Details  Name: Victor Brown MRN: [id number] Date of Birth: 10/27/1965 Referring Provider: Dr. Jerl Santosalldorf   Encounter Date: 02/07/2016      PT End of Session - 02/07/16 1705    Date for PT Re-Evaluation 03/01/16      Past Medical History:  Diagnosis Date  . 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 Sleep apnea    uses cpap    Past Surgical History:  Procedure Laterality Date  . BACK SURGERY  1999  . CARPAL TUNNEL RELEASE Left 2014  . ELBOW DEBRIDEMENT Right 2002  . KNEE ARTHROSCOPY Bilateral   . MANDIBLE SURGERY N/A 1986  . ROTATOR CUFF REPAIR Right 2002  . ROTATOR CUFF REPAIR Left 2008  . TRIGGER FINGER RELEASE Bilateral 2001-2016   multiple  . TRIGGER FINGER RELEASE Left 08/03/2014   Procedure: LEFT LONG FINGER TRIGGER RELEASE;  Surgeon: Mack Hookavid Thompson, MD;  Location: [facility];  Service: Orthopedics;  Laterality: Left;  . TRIGGER FINGER RELEASE Right 06/22/2015   Procedure: RELEASE TRIGGER FINGER/A-1 PULLEY;  Surgeon: Marcene CorningPeter Dalldorf, MD;  [facility];  Service: Orthopedics;  Laterality: Right;  Bier Block Anesthesia    There were no vitals filed for this visit.      Subjective Assessment - 02/07/16 1608    Subjective The US, estim and ionto helped for a few days. Now just the same as usual. Not as tight when he reaches down. Thinks we are on the right track.    Currently in Pain? Yes   Pain Score 3    Pain Location Shoulder   Pain Orientation Left   Pain Descriptors / Indicators Sore   Pain Type Chronic pain                         [facility] Adult PT Treatment/Exercise - 02/07/16 0001      Therapeutic Activites    Therapeutic Activities --  theracane working through the Rt upper trap/leveator      Shoulder Exercises: Supine    Other Supine Exercises shoulder ext stretch 60 sec x 3; horizontal abduction 60 sec x 3 reps      Shoulder Exercises: Seated   Other Seated Exercises upper trap/leveator stretch assisted with Rt UE Lt UE ~30 sec hold x3      Shoulder Exercises: Standing   Other Standing Exercises depression of the Lt UE behind back pulling w/ Rt UE, head tilted to the Rt 30 sec x 3      Shoulder Exercises: Stretch   Other Shoulder Stretches Lat stretch with hands on back of truck x 45 sec x 3 reps     Electrical Stimulation   Electrical Stimulation Location Lt shoulder (inf to clavical, AC joint, lateral shoulder)    Electrical Stimulation Action IFC   Electrical Stimulation Parameters to toeracne   Electrical Stimulation Goals Pain;Tone     Ultrasound   Ultrasound Location Rt lateral cervical/thoracic area proximal and distal to clavical    Ultrasound Parameters 100%; 1.5 w/cm@; 1 mHz; 8 min    Ultrasound Goals Pain;Other (Comment)  tightness      Iontophoresis   Type of Iontophoresis Dexamethasone   Location Lt upper shoulder ~ AC joint area    Dose 80 mAmp patch   Time 8 hr  Vasopneumatic   Number Minutes Vasopneumatic  15 minutes   Vasopnuematic Location  Shoulder   Vasopneumatic Pressure Low   Vasopneumatic Temperature  3*     Manual Therapy   Manual Therapy Soft tissue mobilization;Joint mobilization   Manual therapy comments sitting and supine   Joint Mobilization clavical and 1sr rib mobs patient in supine;    Soft tissue mobilization deep tissue work through the cervical and thoracic musculature on Rt patient sitting and supine                 PT Education - 02/07/16 1704    Education provided Yes   Education Details HEP   Person(s) Educated Patient   Methods Explanation;Demonstration;Tactile cues;Verbal cues;Handout   Comprehension Verbalized understanding;Returned demonstration;Verbal cues required;Tactile cues required             PT Long Term Goals  - 01/25/16 1309      PT LONG TERM GOAL #1   Title Decrease tenderness and tightness to palpation through the Lt shoulder 03/01/16   Time 6   Period Weeks   Status On-going     PT LONG TERM GOAL #2   Title patient reports decreased pain Lt shoulder with functional activities 03/01/16   Time 6   Period Weeks   Status On-going     PT LONG TERM GOAL #3   Title Independent in HEP 03/01/16   Baseline Grip 91 pounds   Time 6   Period Weeks   Status On-going     PT LONG TERM GOAL #4   Title Improve FOTO to </= 28% limitation 03/01/16   Time 6   Period Weeks   Status On-going     PT LONG TERM GOAL #5   Title Further assessment of Lt shoulder dysfunction as indicated 03/01/16   Time 6   Period Weeks   Status On-going             Patient will benefit from skilled therapeutic intervention in order to improve the following deficits and impairments:     Visit Diagnosis: Chronic left shoulder pain  Other symptoms and signs involving the musculoskeletal system  Upper limb weakness  Stiffness of shoulder joint, left     Problem List There are no active problems to display for this patient.   Kinisha Soper Rober MinionP Sitlaly Gudiel PT, MPH  02/07/2016, 5:06 PM  [facility] [address] [place], [place], [address] Phone: [phone]   Fax:  [phone]  Name: Victor Brown MRN: [id number] Date of Birth: 1965/11/03

## 2016-02-07 NOTE — Patient Instructions (Addendum)
[place] Stretch, Sitting    Sit, one hand tucked under hip on side to be stretched, other hand over top of head. Gently pull head to side and backwards. Hold __20-30_ seconds. For more stretch, lean body in direction of head pull. Repeat _3__ times per session. Do _2-3__ sessions per day.    Side Bend, Standing    Stand, one hand grasping other arm above wrist behind body. Pull down while gently tilting head toward pulling side. Hold _20-30 seconds. Repeat _3_ times per session. Do 2-3__ sessions per day.

## 2016-02-10 ENCOUNTER — Ambulatory Visit (INDEPENDENT_AMBULATORY_CARE_PROVIDER_SITE_OTHER): Payer: Managed Care, Other (non HMO) | Admitting: Physical Therapy

## 2016-02-10 DIAGNOSIS — R29898 Other symptoms and signs involving the musculoskeletal system: Secondary | ICD-10-CM | POA: Diagnosis not present

## 2016-02-10 DIAGNOSIS — M25612 Stiffness of left shoulder, not elsewhere classified: Secondary | ICD-10-CM | POA: Diagnosis not present

## 2016-02-10 DIAGNOSIS — M25512 Pain in left shoulder: Secondary | ICD-10-CM

## 2016-02-10 DIAGNOSIS — G8929 Other chronic pain: Secondary | ICD-10-CM

## 2016-02-10 NOTE — Therapy (Signed)
[facility] [address] [place], [place], [address] Phone: [phone]   Fax:  [phone]  Physical Therapy Treatment  Patient Details  Name: Victor Brown MRN: [id number] Date of Birth: [birth year] Referring Provider: Dr. Jerl Santosalldorf   Encounter Date: 02/10/2016      PT End of Session - 02/10/16 1541    Visit Number 4   Number of Visits 12   Date for PT Re-Evaluation 03/01/16   PT Start Time 1534   PT Stop Time 1622   PT Time Calculation (min) 48 min   Activity Tolerance Patient tolerated treatment well      Past Medical History:  Diagnosis Date  . 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 Sleep apnea    uses cpap    Past Surgical History:  Procedure Laterality Date  . BACK SURGERY  1999  . CARPAL TUNNEL RELEASE Left 2014  . ELBOW DEBRIDEMENT Right 2002  . KNEE ARTHROSCOPY Bilateral   . MANDIBLE SURGERY N/A 1986  . ROTATOR CUFF REPAIR Right 2002  . ROTATOR CUFF REPAIR Left 2008  . TRIGGER FINGER RELEASE Bilateral 2001-2016   multiple  . TRIGGER FINGER RELEASE Left 08/03/2014   Procedure: LEFT LONG FINGER TRIGGER RELEASE;  Surgeon: Mack Hookavid Thompson, MD;  Location: [facility];  Service: Orthopedics;  Laterality: Left;  . TRIGGER FINGER RELEASE Right 06/22/2015   Procedure: RELEASE TRIGGER FINGER/A-1 PULLEY;  Surgeon: Marcene CorningPeter Dalldorf, MD;  [facility];  Service: Orthopedics;  Laterality: Right;  Bier Block Anesthesia    There were no vitals filed for this visit.      Subjective Assessment - 02/10/16 1541    Subjective Pt reports he was sore after last session. He feels not as stiff but he is now sore in Lt upper trap area.  He has a couple days of relief after therapy.    Currently in Pain? Yes   Pain Score 3    Pain Location Shoulder   Pain Orientation Left            [facility] PT Assessment - 02/10/16 0001      Assessment   Medical Diagnosis Lt shoulder pain    Referring Provider Dr. Jerl Santosalldorf    Onset Date/Surgical Date 09/08/13  10/22/14   Hand Dominance Left  uses both    Next MD Visit 02/23/16           [facility] Treatment/Exercise - 02/10/16 0001      Shoulder Exercises: Supine   Other Supine Exercises shoulder ext stretch 60 sec x 3.      Shoulder Exercises: Standing   Other Standing Exercises Lt shoulder horiz abdct then horiz add stretch holding on to strap at wall x 30 sec x 3 reps each      Shoulder Exercises: Stretch   Other Shoulder Stretches Lat stretch with hands on back of truck x 45 sec x 3 reps   Other Shoulder Stretches Seated upper trap stretch with hand holding chair x 30 sec x 4     Iontophoresis   Type of Iontophoresis Dexamethasone   Location Lt upper shoulder ~ AC joint area    Dose 80 mAmp patch   Time 8 hr      Vasopneumatic   Number Minutes Vasopneumatic  15 minutes   Vasopnuematic Location  Shoulder   Vasopneumatic Pressure Low   Vasopneumatic Temperature  3*     Manual Therapy   Joint  Mobilization Traverse joint AP, inf grade 3, GH inf glide grade 3 - performed by supervising PT, Midge MiniumBen Zaino            PT Long Term Goals - 01/25/16 1309      PT LONG TERM GOAL #1   Title Decrease tenderness and tightness to palpation through the Lt shoulder 03/01/16   Time 6   Period Weeks   Status On-going     PT LONG TERM GOAL #2   Title patient reports decreased pain Lt shoulder with functional activities 03/01/16   Time 6   Period Weeks   Status On-going     PT LONG TERM GOAL #3   Title Independent in HEP 03/01/16   Baseline Grip 91 pounds   Time 6   Period Weeks   Status On-going     PT LONG TERM GOAL #4   Title Improve FOTO to </= 28% limitation 03/01/16   Time 6   Period Weeks   Status On-going     PT LONG TERM GOAL #5   Title Further assessment of Lt shoulder dysfunction as indicated 03/01/16   Time 6   Period Weeks   Status On-going               Plan - 02/10/16 1619     Clinical Impression Statement Pt has decreased inferior GH glides with manual therapy.  Pt reporting less stiffness and pain in Lt shoulder after therapy sessions.  progressing gradually towards established goals.    Rehab Potential Good   PT Frequency 2x / week   PT Duration 6 weeks   PT Treatment/Interventions Patient/family education;ADLs/Self Care Home Management;Neuromuscular re-education;Cryotherapy;Electrical Stimulation;Iontophoresis 4mg /ml Dexamethasone;Moist Heat;Ultrasound;Therapeutic activities;Therapeutic exercise;Dry needling;Manual techniques   PT Next Visit Plan  Mobs to GH/[place]/ ribs on Lt.    Consulted and Agree with Plan of Care Patient      Patient will benefit from skilled therapeutic intervention in order to improve the following deficits and impairments:  Postural dysfunction, Pain, Increased muscle spasms, Increased fascial restricitons, Decreased activity tolerance  Visit Diagnosis: Chronic left shoulder pain  Other symptoms and signs involving the musculoskeletal system  Upper limb weakness  Stiffness of shoulder joint, left     Problem List There are no active problems to display for this patient.  Mayer CamelJennifer Carlson-Long, PTA 02/10/16 4:25 PM  [facility] [address] [place], [place], [address] Phone: [phone]   Fax:  [phone]  Name: Victor Brown MRN: [id number] Date of Birth: 1965/12/20

## 2016-02-14 ENCOUNTER — Ambulatory Visit (INDEPENDENT_AMBULATORY_CARE_PROVIDER_SITE_OTHER): Payer: Managed Care, Other (non HMO) | Admitting: Physical Therapy

## 2016-02-14 DIAGNOSIS — G8929 Other chronic pain: Secondary | ICD-10-CM | POA: Diagnosis not present

## 2016-02-14 DIAGNOSIS — M25612 Stiffness of left shoulder, not elsewhere classified: Secondary | ICD-10-CM | POA: Diagnosis not present

## 2016-02-14 DIAGNOSIS — M25512 Pain in left shoulder: Secondary | ICD-10-CM | POA: Diagnosis not present

## 2016-02-14 DIAGNOSIS — R29898 Other symptoms and signs involving the musculoskeletal system: Secondary | ICD-10-CM | POA: Diagnosis not present

## 2016-02-14 NOTE — Therapy (Addendum)
[facility] [address] [place], [place], [address] Phone: [phone]   Fax:  [phone]  Physical Therapy Treatment  Patient Details  Name: Victor RuddyMichael S Brown MRN: [id number] Date of Birth: December 05, 1965 Referring Provider: Dr. Jerl Santosalldorf   Encounter Date: 02/14/2016      PT End of Session - 02/14/16 1611    Visit Number 5   Number of Visits 12   Date for PT Re-Evaluation 03/01/16   PT Start Time 1605   PT Stop Time 1654   PT Time Calculation (min) 49 min   Activity Tolerance Patient tolerated treatment well      Past Medical History:  Diagnosis Date  . 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 Sleep apnea    uses cpap    Past Surgical History:  Procedure Laterality Date  . BACK SURGERY  1999  . CARPAL TUNNEL RELEASE Left 2014  . ELBOW DEBRIDEMENT Right 2002  . KNEE ARTHROSCOPY Bilateral   . MANDIBLE SURGERY N/A 1986  . ROTATOR CUFF REPAIR Right 2002  . ROTATOR CUFF REPAIR Left 2008  . TRIGGER FINGER RELEASE Bilateral 2001-2016   multiple  . TRIGGER FINGER RELEASE Left 08/03/2014   Procedure: LEFT LONG FINGER TRIGGER RELEASE;  Surgeon: Mack Hookavid Thompson, MD;  Location: [facility];  Service: Orthopedics;  Laterality: Left;  . TRIGGER FINGER RELEASE Right 06/22/2015   Procedure: RELEASE TRIGGER FINGER/A-1 PULLEY;  Surgeon: Marcene CorningPeter Dalldorf, MD;  [facility];  Service: Orthopedics;  Laterality: Right;  Bier Block Anesthesia    There were no vitals filed for this visit.      Subjective Assessment - 02/14/16 1611    Subjective Pt states he still feels like we are making headway. Not as tight as when he first came in.   Main complaint is still pain in Lt lateral shoulder when laying on  or leaning up against it.    Currently in Pain? Yes   Pain Score 2    Pain Location Shoulder   Pain Orientation Left   Pain Descriptors / Indicators Sore   Pain Relieving Factors agrivating positions             [facility] PT Assessment - 02/14/16 0001      Assessment   Medical Diagnosis Lt shoulder pain    Referring Provider Dr. Jerl Santosalldorf    Onset Date/Surgical Date 09/08/13  10/22/14   Hand Dominance Left  uses both    Next MD Visit 02/23/16           [facility] Treatment/Exercise - 02/14/16 0001      Shoulder Exercises: Stretch   Other Shoulder Stretches Lat stretch with hands on back of truck x 45 sec x 3 reps   Other Shoulder Stretches Seated upper trap stretch with hand holding chair x 30 sec x 4; mid and low level doorway stretches x 2 reps each position x 2 reps; supine horiz abduct (Lt) stretch with arm off table.      Modalities   Modalities Electrical Stimulation;Moist Heat;Ultrasound     Moist Heat Therapy   Number Minutes Moist Heat 15 Minutes   Moist Heat Location Shoulder     Electrical Stimulation   Electrical Stimulation Location Lt shoulder (inf to clavical, AC joint, lateral shoulder)    Electrical Stimulation Action IFC   Electrical Stimulation Parameters to tolerance    Electrical Stimulation Goals Pain;Tone     Ultrasound   Ultrasound Location  Lt  lateral / ant shoulder    Ultrasound Parameters 100%, 1.2 w/cm2, 8 min    Ultrasound Goals Pain;Other (Comment)  tightness      Manual Therapy   Manual Therapy Myofascial release   Joint Mobilization clavical and 1sr rib mobs patient in supine;    Soft tissue mobilization Edge tool assistance to Lt upper pec, upper trap, infraspinatus, deltoid to decrease fascial restrictions.    Myofascial Release to Lt scalene, SCM, platysma, upper trap.                      PT Long Term Goals - 02/14/16 1649      PT LONG TERM GOAL #1   Title Decrease tenderness and tightness to palpation through the Lt shoulder 03/01/16   Time 6   Period Weeks   Status On-going  progressing      PT LONG TERM GOAL #2   Title patient reports decreased pain Lt shoulder with functional activities 03/01/16    Time 6   Period Weeks   Status On-going     PT LONG TERM GOAL #3   Title Independent in HEP 03/01/16   Time 6   Period Weeks   Status On-going     PT LONG TERM GOAL #4   Title Improve FOTO to </= 28% limitation 03/01/16   Time 6   Period Weeks   Status On-going     PT LONG TERM GOAL #5   Title Further assessment of Lt shoulder dysfunction as indicated 03/01/16   Time 6   Period Weeks   Status On-going               Plan - 02/14/16 1650    Clinical Impression Statement Pt was less tender along Lt clavical with manual therapy today.  He is reporting less stiffness during work hours.  Pt is making gradual gains towards established goals.    Rehab Potential Good   PT Frequency 2x / week   PT Duration 6 weeks   PT Treatment/Interventions Patient/family education;ADLs/Self Care Home Management;Neuromuscular re-education;Cryotherapy;Electrical Stimulation;Iontophoresis 4mg /ml Dexamethasone;Moist Heat;Ultrasound;Therapeutic activities;Therapeutic exercise;Dry needling;Manual techniques   PT Next Visit Plan Manual therapy to Rt neck/ [place]/GH.  Modalities as indicated.    Consulted and Agree with Plan of Care Patient      Patient will benefit from skilled therapeutic intervention in order to improve the following deficits and impairments:  Postural dysfunction, Pain, Increased muscle spasms, Increased fascial restricitons, Decreased activity tolerance  Visit Diagnosis: Chronic left shoulder pain  Other symptoms and signs involving the musculoskeletal system  Upper limb weakness  Stiffness of shoulder joint, left     Problem List There are no active problems to display for this patient.  Mayer CamelJennifer Carlson-Long, PTA 02/14/16 4:57 PM  [facility] [address] [place], [place], [address] Phone: [phone]   Fax:  [phone]  Name: Victor RuddyMichael S Brown MRN: [id number] Date of Birth: 1965-11-16

## 2016-02-17 ENCOUNTER — Encounter: Payer: Self-pay | Admitting: Physical Therapy

## 2016-02-17 ENCOUNTER — Ambulatory Visit (INDEPENDENT_AMBULATORY_CARE_PROVIDER_SITE_OTHER): Payer: Managed Care, Other (non HMO) | Admitting: Physical Therapy

## 2016-02-17 DIAGNOSIS — R29898 Other symptoms and signs involving the musculoskeletal system: Secondary | ICD-10-CM | POA: Diagnosis not present

## 2016-02-17 DIAGNOSIS — M25512 Pain in left shoulder: Secondary | ICD-10-CM | POA: Diagnosis not present

## 2016-02-17 DIAGNOSIS — M25612 Stiffness of left shoulder, not elsewhere classified: Secondary | ICD-10-CM

## 2016-02-17 DIAGNOSIS — G8929 Other chronic pain: Secondary | ICD-10-CM | POA: Diagnosis not present

## 2016-02-17 NOTE — Patient Instructions (Signed)
Seated shoulder inferior glide.  Pt declined handout.

## 2016-02-17 NOTE — Therapy (Signed)
[place]  [place], [place], [address] Phone: [phone]   Fax:  [phone]  Physical Therapy Treatment  Patient Details  Name: Victor Brown MRN: [id number] Date of Birth: [birth year] Referring Provider: Dr. Rhona Raider   Encounter Date: 02/17/2016      PT End of Session - 02/17/16 1753    Visit Number 6   Number of Visits 12   Date for PT Re-Evaluation 03/01/16   PT Start Time 1703   PT Stop Time 1745   PT Time Calculation (min) 42 min   Activity Tolerance Patient tolerated treatment well   Behavior During Therapy [facility] for tasks assessed/performed      Past Medical History:  Diagnosis Date  . Neuromuscular disorder ([place])    trigger thumb- R   . Sleep apnea    uses cpap    Past Surgical History:  Procedure Laterality Date  . BACK SURGERY  1999  . CARPAL TUNNEL RELEASE Left 2014  . ELBOW DEBRIDEMENT Right 2002  . KNEE ARTHROSCOPY Bilateral   . MANDIBLE SURGERY N/A 1986  . ROTATOR CUFF REPAIR Right 2002  . ROTATOR CUFF REPAIR Left 2008  . TRIGGER FINGER RELEASE Bilateral 2001-2016   multiple  . TRIGGER FINGER RELEASE Left 08/03/2014   Procedure: LEFT LONG FINGER TRIGGER RELEASE;  Surgeon: Milly Jakob, MD;  Location: [facility];  Service: Orthopedics;  Laterality: Left;  . TRIGGER FINGER RELEASE Right 06/22/2015   Procedure: RELEASE TRIGGER FINGER/A-1 PULLEY;  Surgeon: Melrose Nakayama, MD;  Location: [place];  Service: Orthopedics;  Laterality: Right;  Bier Block Anesthesia    There were no vitals filed for this visit.      Subjective Assessment - 02/17/16 1704    Subjective (P)  Been having some strange problems with his fingers "locking out" into extension several times a day. Shoulder slowly improving, definately making progress.    Currently in Pain? (P)  Yes   Pain Score (P)  2    Pain Location (P)  Shoulder   Pain Orientation (P)  Left   Pain Descriptors / Indicators  (P)  Sore;Dull   Pain Type (P)  Chronic pain   Aggravating Factors  (P)  lying on lt side   Pain Relieving Factors (P)  resting             [facility] PT Assessment - 02/17/16 0001      AROM   Right/Left Shoulder Left   Left Shoulder Flexion --  165 degrees, following session/ cues for scapular retraction     Strength   Left Shoulder Flexion 5/5   Left Shoulder Internal Rotation 5/5   Left Shoulder External Rotation 5/5                     [facility] Adult PT Treatment/Exercise - 02/17/16 0001      Shoulder Exercises: Supine   Other Supine Exercises shoulder flexion     Shoulder Exercises: ROM/Strengthening   Other ROM/Strengthening Exercises External rotation stretch     Manual Therapy   Joint Mobilization glenohumeral distraction/inferior glides grade 3.                 PT Education - 02/17/16 1751    Education provided Yes   Education Details shoulder mechanics   Person(s) Educated Patient   Methods Explanation;Demonstration;Tactile cues;Verbal cues   Comprehension Verbalized understanding             PT Long Term  Goals - 02/17/16 1759      PT LONG TERM GOAL #1   Title Decrease tenderness and tightness to palpation through the Lt shoulder 03/01/16   Time 6   Period Weeks   Status Partially Met     PT LONG TERM GOAL #2   Title patient reports decreased pain Lt shoulder with functional activities 03/01/16   Time 6   Period Weeks   Status On-going     PT LONG TERM GOAL #3   Title Independent in HEP 03/01/16   Time 6   Period Weeks   Status On-going     PT LONG TERM GOAL #4   Title Improve FOTO to </= 28% limitation 03/01/16   Period Weeks   Status On-going     PT LONG TERM GOAL #5   Title Further assessment of Lt shoulder dysfunction as indicated 03/01/16   Time 6   Period Weeks   Status On-going               Plan - 02/17/16 1753    Clinical Impression Statement Pt reports that he has continued to improve but still  having pain throught Lt shoulder. Pt with decreased pain with active shoulder flexion following joint mobilizations. Pt continues to have postural deviations which also are contributing to his continued pain complaints. At this time the pt states that he is going to be out of town for the next couple weeks and he will call to make more appointments if needed.    Rehab Potential Good   PT Frequency 2x / week   PT Duration 6 weeks   PT Treatment/Interventions Patient/family education;ADLs/Self Care Home Management;Neuromuscular re-education;Cryotherapy;Electrical Stimulation;Iontophoresis 34m/ml Dexamethasone;Moist Heat;Ultrasound;Therapeutic activities;Therapeutic exercise;Dry needling;Manual techniques   PT Next Visit Plan Joint mobilizations with inferior glides, external rotation stretches and scapular retraction.    Consulted and Agree with Plan of Care Patient      Patient will benefit from skilled therapeutic intervention in order to improve the following deficits and impairments:  Postural dysfunction, Pain, Increased muscle spasms, Increased fascial restricitons, Decreased activity tolerance  Visit Diagnosis: Chronic left shoulder pain  Other symptoms and signs involving the musculoskeletal system  Stiffness of shoulder joint, left  Upper limb weakness     Problem List There are no active problems to display for this patient.   BLinard Millers PT, CSCS  02/17/2016, 6:02 PM  [facility] [address]Phone: [phone]  Fax:  [phone] Name: MDURRELL BARAJASMRN: [id number] of Birth: [birth year]

## 2016-03-13 ENCOUNTER — Ambulatory Visit (INDEPENDENT_AMBULATORY_CARE_PROVIDER_SITE_OTHER): Payer: Managed Care, Other (non HMO) | Admitting: Physical Therapy

## 2016-03-13 ENCOUNTER — Encounter: Payer: Self-pay | Admitting: Physical Therapy

## 2016-03-13 DIAGNOSIS — M25512 Pain in left shoulder: Secondary | ICD-10-CM | POA: Diagnosis not present

## 2016-03-13 DIAGNOSIS — G8929 Other chronic pain: Secondary | ICD-10-CM | POA: Diagnosis not present

## 2016-03-13 DIAGNOSIS — R29898 Other symptoms and signs involving the musculoskeletal system: Secondary | ICD-10-CM | POA: Diagnosis not present

## 2016-03-13 DIAGNOSIS — M25612 Stiffness of left shoulder, not elsewhere classified: Secondary | ICD-10-CM

## 2016-03-13 NOTE — Therapy (Signed)
[place], [place], [address] Phone: [phone]   Fax:  [phone]  Physical Therapy Treatment  Patient Details  Name: Victor Brown MRN: [id number] Date of Birth: 1965-08-29 Referring Provider: Dr. Rhona Raider   Encounter Date: 03/13/2016      PT End of Session - 03/13/16 1702    Visit Number 7   Number of Visits 16   Date for PT Re-Evaluation 04/10/16   PT Start Time 1608   PT Stop Time 1652   PT Time Calculation (min) 44 min   Activity Tolerance Patient tolerated treatment well   Behavior During Therapy [facility] for tasks assessed/performed      Past Medical History:  Diagnosis Date  . Neuromuscular disorder ([place])    trigger thumb- R   . Sleep apnea    uses cpap    Past Surgical History:  Procedure Laterality Date  . BACK SURGERY  1999  . CARPAL TUNNEL RELEASE Left 2014  . ELBOW DEBRIDEMENT Right 2002  . KNEE ARTHROSCOPY Bilateral   . MANDIBLE SURGERY N/A 1986  . ROTATOR CUFF REPAIR Right 2002  . ROTATOR CUFF REPAIR Left 2008  . TRIGGER FINGER RELEASE Bilateral 2001-2016   multiple  . TRIGGER FINGER RELEASE Left 08/03/2014   Procedure: LEFT LONG FINGER TRIGGER RELEASE;  Surgeon: Milly Jakob, MD;  Location: [facility];  Service: Orthopedics;  Laterality: Left;  . TRIGGER FINGER RELEASE Right 06/22/2015   Procedure: RELEASE TRIGGER FINGER/A-1 PULLEY;  Surgeon: Melrose Nakayama, MD;  Location: [place];  Service: Orthopedics;  Laterality: Right;  Bier Block Anesthesia    There were no vitals filed for this visit.      Subjective Assessment - 03/13/16 1620    Subjective Reports that he got back from vacation just last night. States that his shoulder seems to be doing a lot better overall. Describes that he is moving it more and having less pain. As far as his hand goes, he may be referred to a neurosurgeon depending on how his follow-up appointment goes in a couple weeks.    Pain Score 3    Pain Location Shoulder   Pain Orientation Left   Pain Descriptors / Indicators Dull;Aching   Pain Type Chronic pain   Aggravating Factors  picking up heavy/awkward object   Pain Relieving Factors rest, not lifting stupid stuff            [facility] PT Assessment - 03/13/16 0001      Assessment   Medical Diagnosis Lt shoulder pain    Onset Date/Surgical Date 09/08/13  10/22/14   Hand Dominance Left  uses both      AROM   Left Shoulder Flexion 180 Degrees   Left Shoulder Internal Rotation --  low thoracic level   Left Shoulder External Rotation --  decreased range compated to contralateral side.                      [facility] Adult PT Treatment/Exercise - 03/13/16 0001      Shoulder Exercises: Supine   External Rotation Strengthening;Left;10 reps   External Rotation Limitations moderate manual resistance   Internal Rotation Strengthening;Left;10 reps   Internal Rotation Limitations moderate manual resistance   Other Supine Exercises shoulder flexion     Shoulder Exercises: ROM/Strengthening   UBE (Upper Arm Bike) L4 X5 min   Other ROM/Strengthening Exercises Lt shoulder external/internal rotation stretches with end range hold   Other ROM/Strengthening Exercises  Reviewed shoulder ER stretches, pt preferring to continue with wand stretch.      Manual Therapy   Joint Mobilization glenohumeral distraction/inferior/posterior glides grade 3.                 PT Education - 03/13/16 1701    Education provided Yes   Education Details encouraging continuation of HEP, working on external rotation on [employer]) Educated Patient   Methods Explanation;Demonstration   Comprehension Verbalized understanding             PT Long Term Goals - 03/13/16 1619      PT LONG TERM GOAL #1   Title Decrease tenderness and tightness to palpation through the Lt shoulder (04/10/16)   Time 4   Period Weeks   Status Partially Met     PT LONG TERM GOAL  #2   Title patient reports decreased pain Lt shoulder with functional activities  (04/10/16)   Time 4   Period Weeks   Status On-going     PT LONG TERM GOAL #3   Title Independent in HEP  (04/10/16)   Time 4   Period Weeks   Status On-going     PT LONG TERM GOAL #4   Title Improve FOTO to </= 28% limitation  (04/10/16)   Period Weeks   Status On-going     PT LONG TERM GOAL #5   Title Further assessment of Lt shoulder dysfunction as indicated 03/01/16   Status Achieved               Plan - 03/13/16 1706    Clinical Impression Statement Pt states that he feels that his Lt shoulder has made significant progress with decreased pain and improved ROM. Still having pain at times, especially when lifting awkward objects. The patient's Lt shoulder flexion has increased to full range but he continue to have deficits with external rotation. Will continue with OPPT services to address.    Rehab Potential Good   PT Frequency 2x / week   PT Duration 4 weeks   PT Treatment/Interventions Patient/family education;ADLs/Self Care Home Management;Neuromuscular re-education;Cryotherapy;Electrical Stimulation;Iontophoresis 59m/ml Dexamethasone;Moist Heat;Ultrasound;Therapeutic activities;Therapeutic exercise;Dry needling;Manual techniques   PT Next Visit Plan Joint mobilizations with inferior glides, external rotation stretches and scapular retraction.    Consulted and Agree with Plan of Care Patient      Patient will benefit from skilled therapeutic intervention in order to improve the following deficits and impairments:  Postural dysfunction, Pain, Increased muscle spasms, Increased fascial restricitons, Decreased activity tolerance  Visit Diagnosis: Chronic left shoulder pain - Plan: PT plan of care cert/re-cert  Other symptoms and signs involving the musculoskeletal system - Plan: PT plan of care cert/re-cert  Stiffness of shoulder joint, left - Plan: PT plan of care cert/re-cert  Upper  limb weakness - Plan: PT plan of care cert/re-cert     Problem List There are no active problems to display for this patient.   BLinard Millers12/07/2015, 5:13 PM  [facility] [address]Phone: [phone]  Fax:  [phone] Name: MKAINON VARADYMRN: [id number] of Birth: 5May 21, 1967

## 2016-03-16 ENCOUNTER — Ambulatory Visit (INDEPENDENT_AMBULATORY_CARE_PROVIDER_SITE_OTHER): Payer: Managed Care, Other (non HMO) | Admitting: Physical Therapy

## 2016-03-16 DIAGNOSIS — R29898 Other symptoms and signs involving the musculoskeletal system: Secondary | ICD-10-CM

## 2016-03-16 DIAGNOSIS — G8929 Other chronic pain: Secondary | ICD-10-CM | POA: Diagnosis not present

## 2016-03-16 DIAGNOSIS — M25512 Pain in left shoulder: Secondary | ICD-10-CM

## 2016-03-16 DIAGNOSIS — M25612 Stiffness of left shoulder, not elsewhere classified: Secondary | ICD-10-CM

## 2016-03-16 NOTE — Therapy (Signed)
[place], [place], [address] Phone: [phone]   Fax:  [phone]  Physical Therapy Treatment  Patient Details  Name: Victor Brown MRN: [id number] Date of Birth: 1965-05-29 Referring Provider: Dr. Rhona Raider   Encounter Date: 03/16/2016      PT End of Session - 03/16/16 1714    Visit Number 8   Number of Visits 16   Date for PT Re-Evaluation 04/10/16   PT Start Time 1633  pt arrived late   PT Stop Time 1715   PT Time Calculation (min) 42 min   Activity Tolerance Patient tolerated treatment well   Behavior During Therapy [facility] for tasks assessed/performed      Past Medical History:  Diagnosis Date  . Neuromuscular disorder ([place])    trigger thumb- R   . Sleep apnea    uses cpap    Past Surgical History:  Procedure Laterality Date  . BACK SURGERY  1999  . CARPAL TUNNEL RELEASE Left 2014  . ELBOW DEBRIDEMENT Right 2002  . KNEE ARTHROSCOPY Bilateral   . MANDIBLE SURGERY N/A 1986  . ROTATOR CUFF REPAIR Right 2002  . ROTATOR CUFF REPAIR Left 2008  . TRIGGER FINGER RELEASE Bilateral 2001-2016   multiple  . TRIGGER FINGER RELEASE Left 08/03/2014   Procedure: LEFT LONG FINGER TRIGGER RELEASE;  Surgeon: Milly Jakob, MD;  Location: [place];  Service: Orthopedics;  Laterality: Left;  . TRIGGER FINGER RELEASE Right 06/22/2015   Procedure: RELEASE TRIGGER FINGER/A-1 PULLEY;  Surgeon: Melrose Nakayama, MD;  Location: [place];  Service: Orthopedics;  Laterality: Right;  Bier Block Anesthesia    There were no vitals filed for this visit.      Subjective Assessment - 03/16/16 1718    Subjective Pt reports he feels like we are moving in right direction with Lt shoulder. It is moving better with less pain.  Although he is having more and more symptoms in his Lt hand.  He is seeing a neurosurgeon on 12/20.    Currently in Pain? 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                          [facility] Adult PT Treatment/Exercise - 03/16/16 0001      Shoulder Exercises: Supine   Other Supine Exercises Lt UE nerve glides with Lt arm horiz abdct off table x 10 reps.      Shoulder Exercises: Standing   Other Standing Exercises Lt wrist flex/ext stretches x 30 sec x 3 reps      Modalities   Modalities Cryotherapy     Moist Heat Therapy   Number Minutes Moist Heat 10 Minutes   Moist Heat Location Shoulder  Lt     Manual Therapy   Manual Therapy Passive ROM;Myofascial release;Soft tissue mobilization;Joint mobilization   Manual therapy comments pt in supine.   Pt very point tender in pec attachment along sternum and clavical.    Joint Mobilization glenohumeral distraction/inferior/posterior glides grade 3.   Clavical and first rib mobs    Soft tissue mobilization TPR to pec along sternum.    Myofascial Release to Lt platysma, pec minor, pec major, suboccipital release.    Passive  ROM traction with Lt shoulder flexion, abduction.  Lt shoulder ER.                       PT Long Term Goals - 03/13/16 1619      PT LONG TERM GOAL #1   Title Decrease tenderness and tightness to palpation through the Lt shoulder (04/10/16)   Time 4   Period Weeks   Status Partially Met     PT LONG TERM GOAL #2   Title patient reports decreased pain Lt shoulder with functional activities  (04/10/16)   Time 4   Period Weeks   Status On-going     PT LONG TERM GOAL #3   Title Independent in HEP  (04/10/16)   Time 4   Period Weeks   Status On-going     PT LONG TERM GOAL #4   Title Improve FOTO to </= 28% limitation  (04/10/16)   Period Weeks   Status On-going     PT LONG TERM GOAL #5   Title Further assessment of Lt shoulder dysfunction as indicated 03/01/16   Status  Achieved               Plan - 03/16/16 1733    Rehab Potential Good   PT Frequency 2x / week   PT Duration 4 weeks   PT Treatment/Interventions Patient/family education;ADLs/Self Care Home Management;Neuromuscular re-education;Cryotherapy;Electrical Stimulation;Iontophoresis 43m/ml Dexamethasone;Moist Heat;Ultrasound;Therapeutic activities;Therapeutic exercise;Dry needling;Manual techniques   PT Next Visit Plan Joint mobilizations with inferior glides, external rotation stretches and scapular retraction.     Consulted and Agree with Plan of Care Patient      Patient will benefit from skilled therapeutic intervention in order to improve the following deficits and impairments:  Postural dysfunction, Pain, Increased muscle spasms, Increased fascial restricitons, Decreased activity tolerance  Visit Diagnosis: Chronic left shoulder pain  Other symptoms and signs involving the musculoskeletal system  Stiffness of shoulder joint, left     Problem List There are no active problems to display for this patient.  JKerin Perna PTA 03/16/16 5:34 PM  [place] [address]Phone: [phone]  Fax:  [phone] Name: Victor LANDSTROMMRN: [id number] of Birth: [birth year]

## 2016-03-20 ENCOUNTER — Ambulatory Visit (INDEPENDENT_AMBULATORY_CARE_PROVIDER_SITE_OTHER): Payer: Managed Care, Other (non HMO) | Admitting: Physical Therapy

## 2016-03-20 DIAGNOSIS — M25512 Pain in left shoulder: Secondary | ICD-10-CM | POA: Diagnosis not present

## 2016-03-20 DIAGNOSIS — M25612 Stiffness of left shoulder, not elsewhere classified: Secondary | ICD-10-CM | POA: Diagnosis not present

## 2016-03-20 DIAGNOSIS — R29898 Other symptoms and signs involving the musculoskeletal system: Secondary | ICD-10-CM

## 2016-03-20 DIAGNOSIS — G8929 Other chronic pain: Secondary | ICD-10-CM | POA: Diagnosis not present

## 2016-03-20 NOTE — Therapy (Signed)
[place], [place], [address] Phone: [phone]   Fax:  [phone]  Physical Therapy Treatment  Patient Details  Name: Victor Brown MRN: [id number] Date of Birth: [birth year] Referring Provider: Dr. Rhona Raider   Encounter Date: 03/20/2016      PT End of Session - 03/20/16 1614    Visit Number 8   Number of Visits 16   Date for PT Re-Evaluation 04/10/16   PT Start Time 1612  pt in late   PT Stop Time 2202   PT Time Calculation (min) 46 min   Activity Tolerance Patient tolerated treatment well      Past Medical History:  Diagnosis Date  . 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 Sleep apnea    uses cpap    Past Surgical History:  Procedure Laterality Date  . BACK SURGERY  1999  . CARPAL TUNNEL RELEASE Left 2014  . ELBOW DEBRIDEMENT Right 2002  . KNEE ARTHROSCOPY Bilateral   . MANDIBLE SURGERY N/A 1986  . ROTATOR CUFF REPAIR Right 2002  . ROTATOR CUFF REPAIR Left 2008  . TRIGGER FINGER RELEASE Bilateral 2001-2016   multiple  . TRIGGER FINGER RELEASE Left 08/03/2014   Procedure: LEFT LONG FINGER TRIGGER RELEASE;  Surgeon: Milly Jakob, MD;  Location: [place];  Service: Orthopedics;  Laterality: Left;  . TRIGGER FINGER RELEASE Right 06/22/2015   Procedure: RELEASE TRIGGER FINGER/A-1 PULLEY;  Surgeon: Melrose Nakayama, MD;  Location: [place];  Service: Orthopedics;  Laterality: Right;  Bier Block Anesthesia    There were no vitals filed for this visit.      Subjective Assessment - 03/20/16 1613    Subjective Pt reports he fell Friday in the snow, landing on his shoulder and buttocks.  He is very sore in his low back 5/10.    Patient Stated Goals see if you can help anything   Currently in Pain? Yes   Pain Score 3    Pain Location Shoulder   Pain Orientation Left   Pain Descriptors / Indicators Aching   Pain Type Chronic pain   Pain Frequency  Intermittent   Aggravating Factors  working, welding   Pain Relieving Factors rest   Effect of Pain on Daily Activities pain in low back today due to fall on Friday.             [facility] PT Assessment - 03/20/16 0001      Assessment   Medical Diagnosis Lt shoulder pain      AROM   Right/Left Shoulder --  Lt shoulder WNL                     [facility] Adult PT Treatment/Exercise - 03/20/16 0001      Shoulder Exercises: Sidelying   External Rotation Strengthening;Left;20 reps;Weights  towel under elbow   External Rotation Weight (lbs) 4     Shoulder Exercises: ROM/Strengthening   UBE (Upper Arm Bike) L4 X6 min     Modalities   Modalities Cryotherapy     Moist Heat Therapy   Number Minutes Moist Heat 12 Minutes   Moist Heat Location Cervical;Shoulder  Lt     Manual Therapy   Manual Therapy Soft tissue mobilization;Joint mobilization   Joint Mobilization prone c & upper T spine mobs, pain and tightness with CPA C7/T1, and Rt UPA mobs C7/T1, this decreased with grade III mobs   Soft tissue mobilization Lt  pecs, teres minor, upper traps & scalenes                     PT Long Term Goals - 03/20/16 1654      PT LONG TERM GOAL #1   Title Decrease tenderness and tightness to palpation through the Lt shoulder (04/10/16)   Status Partially Met     PT LONG TERM GOAL #2   Title patient reports decreased pain Lt shoulder with functional activities  (04/10/16)   Status On-going     PT LONG TERM GOAL #3   Title Independent in HEP  (04/10/16)   Status Achieved     PT LONG TERM GOAL #4   Title Improve FOTO to </= 28% limitation  (04/10/16)   Status On-going     PT LONG TERM GOAL #5   Title Further assessment of Lt shoulder dysfunction as indicated 03/01/16   Status Achieved               Plan - 03/20/16 1654    Clinical Impression Statement Victor Brown with full functional Lt shoulder ROM, does have some pain and stiffness in his vertebrae lower  cervical and upper thoracic. This did decrease with mobilizations. It is possible he may need higher level strengthening of his Lt RTC to help maintain proper positioning of the humeral head when working .    Rehab Potential Good   PT Frequency 2x / week   PT Duration 4 weeks   PT Treatment/Interventions Patient/family education;ADLs/Self Care Home Management;Neuromuscular re-education;Cryotherapy;Electrical Stimulation;Iontophoresis 6m/ml Dexamethasone;Moist Heat;Ultrasound;Therapeutic activities;Therapeutic exercise;Dry needling;Manual techniques   PT Next Visit Plan assess response to spinal mobs.    Consulted and Agree with Plan of Care Patient      Patient will benefit from skilled therapeutic intervention in order to improve the following deficits and impairments:  Postural dysfunction, Pain, Increased muscle spasms, Increased fascial restricitons, Decreased activity tolerance  Visit Diagnosis: Chronic left shoulder pain  Other symptoms and signs involving the musculoskeletal system  Stiffness of shoulder joint, left  Upper limb weakness     Problem List There are no active problems to display for this patient.   SJeral PinchPT  03/20/2016, 4:57 PM  [facility] [address]Phone: [phone]  Fax:  [phone] Name: Victor TOWELLMRN: [id number] of Birth: 511-11-1965

## 2016-03-23 ENCOUNTER — Ambulatory Visit (INDEPENDENT_AMBULATORY_CARE_PROVIDER_SITE_OTHER): Payer: Managed Care, Other (non HMO) | Admitting: Physical Therapy

## 2016-03-23 DIAGNOSIS — M25512 Pain in left shoulder: Secondary | ICD-10-CM

## 2016-03-23 DIAGNOSIS — M25612 Stiffness of left shoulder, not elsewhere classified: Secondary | ICD-10-CM

## 2016-03-23 DIAGNOSIS — R29898 Other symptoms and signs involving the musculoskeletal system: Secondary | ICD-10-CM | POA: Diagnosis not present

## 2016-03-23 DIAGNOSIS — G8929 Other chronic pain: Secondary | ICD-10-CM | POA: Diagnosis not present

## 2016-03-23 NOTE — Therapy (Signed)
[place], [place], [address] Phone: [phone]   Fax:  [phone]  Physical Therapy Treatment  Patient Details  Name: Victor Brown MRN: [id number] Date of Birth: [birth year] Referring Provider: Dr. Rhona Raider   Encounter Date: 03/23/2016      PT End of Session - 03/23/16 1717    Visit Number 9   Number of Visits 16   Date for PT Re-Evaluation 04/10/16   PT Start Time 9563  pt arrived late   PT Stop Time 1757   PT Time Calculation (min) 44 min   Activity Tolerance Patient tolerated treatment well   Behavior During Therapy [facility] for tasks assessed/performed      Past Medical History:  Diagnosis Date  . 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 Sleep apnea    uses cpap    Past Surgical History:  Procedure Laterality Date  . BACK SURGERY  1999  . CARPAL TUNNEL RELEASE Left 2014  . ELBOW DEBRIDEMENT Right 2002  . KNEE ARTHROSCOPY Bilateral   . MANDIBLE SURGERY N/A 1986  . ROTATOR CUFF REPAIR Right 2002  . ROTATOR CUFF REPAIR Left 2008  . TRIGGER FINGER RELEASE Bilateral 2001-2016   multiple  . TRIGGER FINGER RELEASE Left 08/03/2014   Procedure: LEFT LONG FINGER TRIGGER RELEASE;  Surgeon: Milly Jakob, MD;  Location: [place];  Service: Orthopedics;  Laterality: Left;  . TRIGGER FINGER RELEASE Right 06/22/2015   Procedure: RELEASE TRIGGER FINGER/A-1 PULLEY;  Surgeon: Melrose Nakayama, MD;  Location: [place];  Service: Orthopedics;  Laterality: Right;  Bier Block Anesthesia    There were no vitals filed for this visit.      Subjective Assessment - 03/23/16 1758    Subjective Pt reports his neck and shoulder (L) are feeling like they have more motion since last session.  Pain in left shoulder with leaning through elbow is slightly less painful now.     Currently in Pain? Yes   Pain Score 2    Pain Location Shoulder   Pain Orientation Left   Pain Descriptors  / Indicators [facility] PT Assessment - 03/23/16 0001      Assessment   Medical Diagnosis Lt shoulder pain    Referring Provider Dr. Rhona Raider    Onset Date/Surgical Date 09/08/13  10/22/14   Hand Dominance Left   Next MD Visit 03/31/16          [facility] Adult PT Treatment/Exercise - 03/23/16 0001      Shoulder Exercises: Supine   External Rotation 10 reps;Strengthening;Theraband   Theraband Level (Shoulder External Rotation) Level 4 (Blue)  shoulder abdct 90 deg.    Other Supine Exercises Lt sash with blue band x 10 reps x 2 sets     Shoulder Exercises: Prone   Other Prone Exercises Plank with hands on black mat (feet on ground) with rotation to side plank x 3 reps.      Shoulder Exercises: Sidelying   External Rotation Strengthening;Left;20 reps;Weights  towel under elbow   External Rotation Weight (lbs) 4     Shoulder Exercises: Standing   External Rotation Strengthening;Left;10 reps;Theraband  green band      Shoulder Exercises: ROM/Strengthening   UBE (Upper Arm Bike) L5: 2 min forward/ 1 min backward.      Shoulder Exercises: Stretch   External Rotation Stretch 5 reps;20 seconds  with cane, various  angles    Other Shoulder Stretches Lt shoulder girdle stretch at various angles holding on to strap in door frame.   Lat stretch in standing x 30 sec x 3 reps    Other Shoulder Stretches Blue theraband held in both hands arcs up and over head x 5 reps      Moist Heat Therapy   Number Minutes Moist Heat 10 Minutes   Moist Heat Location Shoulder  Lt      Manual Therapy   Soft tissue mobilization TPR to Lt teres minor, Lt subscap, Lt pec at clavical / sternal attachement.    Myofascial Release Lt pec                      PT Long Term Goals - 03/20/16 1654      PT LONG TERM GOAL #1   Title Decrease tenderness and tightness to palpation through the Lt shoulder (04/10/16)   Status Partially Met     PT LONG TERM GOAL #2   Title patient  reports decreased pain Lt shoulder with functional activities  (04/10/16)   Status On-going     PT LONG TERM GOAL #3   Title Independent in HEP  (04/10/16)   Status Achieved     PT LONG TERM GOAL #4   Title Improve FOTO to </= 28% limitation  (04/10/16)   Status On-going     PT LONG TERM GOAL #5   Title Further assessment of Lt shoulder dysfunction as indicated 03/01/16   Status Achieved               Plan - 03/23/16 1757    Clinical Impression Statement Pt was point tender in Lt subscap and Lt pec min/major with manual therapy.  Pt tolerated all exercises well, requiring some tactile cues for posture with ER exercise.      Rehab Potential Good   PT Frequency 2x / week   PT Duration 4 weeks   PT Treatment/Interventions Patient/family education;ADLs/Self Care Home Management;Neuromuscular re-education;Cryotherapy;Electrical Stimulation;Iontophoresis 53m/ml Dexamethasone;Moist Heat;Ultrasound;Therapeutic activities;Therapeutic exercise;Dry needling;Manual techniques   PT Next Visit Plan Continue manual therapy to Lt shoulder, progress ER for HEP.    Consulted and Agree with Plan of Care Patient      Patient will benefit from skilled therapeutic intervention in order to improve the following deficits and impairments:  Postural dysfunction, Pain, Increased muscle spasms, Increased fascial restricitons, Decreased activity tolerance  Visit Diagnosis: Chronic left shoulder pain  Other symptoms and signs involving the musculoskeletal system  Stiffness of shoulder joint, left     Problem List There are no active problems to display for this patient.  JKerin Perna PTA 03/23/16 6:04 PM  [place] [address]Phone: [phone]  Fax:  [phone] Name: Victor MCCADDENMRN: [id number] of Birth: [birth year]

## 2016-03-27 ENCOUNTER — Ambulatory Visit (INDEPENDENT_AMBULATORY_CARE_PROVIDER_SITE_OTHER): Payer: Managed Care, Other (non HMO) | Admitting: Physical Therapy

## 2016-03-27 DIAGNOSIS — G8929 Other chronic pain: Secondary | ICD-10-CM

## 2016-03-27 DIAGNOSIS — R29898 Other symptoms and signs involving the musculoskeletal system: Secondary | ICD-10-CM

## 2016-03-27 DIAGNOSIS — M25612 Stiffness of left shoulder, not elsewhere classified: Secondary | ICD-10-CM

## 2016-03-27 DIAGNOSIS — M25512 Pain in left shoulder: Secondary | ICD-10-CM

## 2016-03-27 NOTE — Therapy (Signed)
[place], [place], [address] Phone: [phone]   Fax:  [phone]  Physical Therapy Treatment  Patient Details  Name: Victor Brown MRN: [id number] Date of Birth: 1965-07-24 Referring Provider: Dr. Rhona Raider  Encounter Date: 03/27/2016      PT End of Session - 03/27/16 1616    Visit Number 10   Number of Visits 16   PT Start Time 5465  pt arrived late   PT Stop Time 1655   PT Time Calculation (min) 38 min   Activity Tolerance Patient tolerated treatment well      Past Medical History:  Diagnosis Date  . 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 Sleep apnea    uses cpap    Past Surgical History:  Procedure Laterality Date  . BACK SURGERY  1999  . CARPAL TUNNEL RELEASE Left 2014  . ELBOW DEBRIDEMENT Right 2002  . KNEE ARTHROSCOPY Bilateral   . MANDIBLE SURGERY N/A 1986  . ROTATOR CUFF REPAIR Right 2002  . ROTATOR CUFF REPAIR Left 2008  . TRIGGER FINGER RELEASE Bilateral 2001-2016   multiple  . TRIGGER FINGER RELEASE Left 08/03/2014   Procedure: LEFT LONG FINGER TRIGGER RELEASE;  Surgeon: Milly Jakob, MD;  Location: [place];  Service: Orthopedics;  Laterality: Left;  . TRIGGER FINGER RELEASE Right 06/22/2015   Procedure: RELEASE TRIGGER FINGER/A-1 PULLEY;  Surgeon: Melrose Nakayama, MD;  Location: [place];  Service: Orthopedics;  Laterality: Right;  Bier Block Anesthesia    There were no vitals filed for this visit.      Subjective Assessment - 03/27/16 1616    Subjective Pt reports he was very sore after last visit. He "layed on heating pad" most of Saturday.  He also said he slept on Lt shoulder most of the night; when he woke up Saturday morning his arm was numb.  Today it is still sore.    Currently in Pain? Yes   Pain Score 2    Pain Location Shoulder   Pain Orientation Left   Pain Descriptors / Indicators Dull   Aggravating Factors  welding,  laying on Lt shoulder    Pain Relieving Factors rest, heat.             [facility] PT Assessment - 03/27/16 0001      Assessment   Medical Diagnosis Lt shoulder pain    Referring Provider Dr. Rhona Raider   Onset Date/Surgical Date 09/08/13   Hand Dominance Left   Next MD Visit 03/31/16     Observation/Other Assessments   Focus on Therapeutic Outcomes (FOTO)  31% limited          [facility] Adult PT Treatment/Exercise - 03/27/16 0001      Shoulder Exercises: Standing   External Rotation Left;10 reps;Theraband  4 sets at various angles.     Theraband Level (Shoulder External Rotation) Level 3 (Green)     Shoulder Exercises: ROM/Strengthening   UBE (Upper Arm Bike) L3: 2 min forward/ 2 min backward.      Shoulder Exercises: Stretch   Wall Stretch - Flexion 2 reps   Other Shoulder Stretches Lt shoulder girdle stretch at various angles holding on to strap in door frame.   Lat stretch in standing x 30 sec x 3 reps    Other Shoulder Stretches Doorway stretch mid, high, and low x 30 sec x 2 reps each level.   Arcs up and over head with band/strap  Modalities   Modalities Cryotherapy;Iontophoresis     Cryotherapy   Number Minutes Cryotherapy 10 Minutes   Cryotherapy Location Shoulder  Lt   Type of Cryotherapy Ice pack     Iontophoresis   Type of Iontophoresis Dexamethasone   Location Lt supraspinatus insertion    Dose 120 mA    Time 8 hr patch                     PT Long Term Goals - 03/20/16 1654      PT LONG TERM GOAL #1   Title Decrease tenderness and tightness to palpation through the Lt shoulder (04/10/16)   Status Partially Met     PT LONG TERM GOAL #2   Title patient reports decreased pain Lt shoulder with functional activities  (04/10/16)   Status On-going     PT LONG TERM GOAL #3   Title Independent in HEP  (04/10/16)   Status Achieved     PT LONG TERM GOAL #4   Title Improve FOTO to </= 28% limitation  (04/10/16)   Status On-going     PT LONG TERM  GOAL #5   Title Further assessment of Lt shoulder dysfunction as indicated 03/01/16   Status Achieved               Plan - 03/27/16 1653    Clinical Impression Statement Pt point tender in Lt supraspinatus. He fatigues quickly with ER exercise.  He continues to require tactile cues for posture with exercise.  Pt making gradual gains towards establishd goals.    Rehab Potential Good   PT Frequency 2x / week   PT Duration 4 weeks   PT Treatment/Interventions Patient/family education;ADLs/Self Care Home Management;Neuromuscular re-education;Cryotherapy;Electrical Stimulation;Iontophoresis 33m/ml Dexamethasone;Moist Heat;Ultrasound;Therapeutic activities;Therapeutic exercise;Dry needling;Manual techniques   PT Next Visit Plan Continue manual therapy to Lt shoulder, progress ER for HEP.    Consulted and Agree with Plan of Care Patient      Patient will benefit from skilled therapeutic intervention in order to improve the following deficits and impairments:  Postural dysfunction, Pain, Increased muscle spasms, Increased fascial restricitons, Decreased activity tolerance  Visit Diagnosis: Chronic left shoulder pain  Other symptoms and signs involving the musculoskeletal system  Stiffness of shoulder joint, left     Problem List There are no active problems to display for this patient.  JKerin Perna PTA 03/27/16 4:55 PM  [facility]St[facility] [address]Phone: [phone]  Fax:  [phone] Name: Victor DENNIEMRN: [id number] of Birth: 510/02/1966

## 2016-03-30 ENCOUNTER — Ambulatory Visit (INDEPENDENT_AMBULATORY_CARE_PROVIDER_SITE_OTHER): Payer: Managed Care, Other (non HMO) | Admitting: Physical Therapy

## 2016-03-30 DIAGNOSIS — G8929 Other chronic pain: Secondary | ICD-10-CM

## 2016-03-30 DIAGNOSIS — M25512 Pain in left shoulder: Secondary | ICD-10-CM

## 2016-03-30 DIAGNOSIS — R29898 Other symptoms and signs involving the musculoskeletal system: Secondary | ICD-10-CM

## 2016-03-30 DIAGNOSIS — M25612 Stiffness of left shoulder, not elsewhere classified: Secondary | ICD-10-CM

## 2016-03-30 NOTE — Therapy (Signed)
[place], [place], [address] Phone: [phone]   Fax:  [phone]  Physical Therapy Treatment  Patient Details  Name: Victor Brown MRN: [id number] Date of Birth: 09-04-1965 Referring Provider: Dr Rhona Raider  Encounter Date: 03/30/2016      PT End of Session - 03/30/16 1710    Visit Number 11   Number of Visits 16   Date for PT Re-Evaluation 04/10/16   PT Start Time 1710  pt in late   PT Stop Time 1743   PT Time Calculation (min) 33 min   Activity Tolerance Patient tolerated treatment well      Past Medical History:  Diagnosis Date  . 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 Sleep apnea    uses cpap    Past Surgical History:  Procedure Laterality Date  . BACK SURGERY  1999  . CARPAL TUNNEL RELEASE Left 2014  . ELBOW DEBRIDEMENT Right 2002  . KNEE ARTHROSCOPY Bilateral   . MANDIBLE SURGERY N/A 1986  . ROTATOR CUFF REPAIR Right 2002  . ROTATOR CUFF REPAIR Left 2008  . TRIGGER FINGER RELEASE Bilateral 2001-2016   multiple  . TRIGGER FINGER RELEASE Left 08/03/2014   Procedure: LEFT LONG FINGER TRIGGER RELEASE;  Surgeon: Milly Jakob, MD;  Location: [place];  Service: Orthopedics;  Laterality: Left;  . TRIGGER FINGER RELEASE Right 06/22/2015   Procedure: RELEASE TRIGGER FINGER/A-1 PULLEY;  Surgeon: Melrose Nakayama, MD;  Location: [facility];  Service: Orthopedics;  Laterality: Right;  Bier Block Anesthesia    There were no vitals filed for this visit.      Subjective Assessment - 03/30/16 1711    Subjective Pt reports he saw th MD yesterday and told he wants him to try a new drug for 30 days to see if it helps.  And to stop therapy   Currently in Pain? Yes   Pain Score 3    Pain Location Shoulder   Pain Orientation Left   Pain Descriptors / Indicators Dull   Pain Type Chronic pain   Pain Onset More than a month ago   Pain Frequency Intermittent             [facility] PT Assessment - 03/30/16 0001      Assessment   Medical Diagnosis Lt shoulder pain    Referring Provider Dr Rhona Raider   Onset Date/Surgical Date 09/08/13   Hand Dominance Left     Observation/Other Assessments   Focus on Therapeutic Outcomes (FOTO)  31% limited     AROM   Right/Left Shoulder --  WNL     Strength   Right/Left Shoulder --  bilat WNL                     [facility] Adult PT Treatment/Exercise - 03/30/16 0001      Shoulder Exercises: ROM/Strengthening   UBE (Upper Arm Bike) L4: 2 min forward/ 2 min backward.      Shoulder Exercises: Stretch   Other Shoulder Stretches bilat pec stretch with strap    Other Shoulder Stretches back of shoulder stretch     Iontophoresis   Type of Iontophoresis Dexamethasone   Location Lt supraspinatus insertion    Dose 120 mA    Time 8 hr patch     Manual Therapy   Manual Therapy --  PT Long Term Goals - 03/30/16 1713      PT LONG TERM GOAL #1   Title Decrease tenderness and tightness to palpation through the Lt shoulder (04/10/16)   Status Partially Met     PT LONG TERM GOAL #2   Title patient reports decreased pain Lt shoulder with functional activities  (04/10/16)   Status Not Met     PT LONG TERM GOAL #3   Title Independent in HEP  (04/10/16)   Status Achieved     PT LONG TERM GOAL #4   Title Improve FOTO to </= 28% limitation  (04/10/16)   Status Not Met     PT LONG TERM GOAL #5   Title Further assessment of Lt shoulder dysfunction as indicated 03/01/16   Status Achieved               Plan - 03/30/16 1749    Clinical Impression Statement This is [name]'s last visit, he is gong to try a new medicine per MD recommendations and see if he can get relief with that   PT Next Visit Plan discharge to HEP   Consulted and Agree with Plan of Care Patient      Patient will benefit from skilled therapeutic intervention in order to improve the following  deficits and impairments:  Postural dysfunction, Pain, Increased muscle spasms, Increased fascial restricitons, Decreased activity tolerance  Visit Diagnosis: Chronic left shoulder pain  Other symptoms and signs involving the musculoskeletal system  Stiffness of shoulder joint, left  Upper limb weakness     Problem List There are no active problems to display for this patient.   Jeral Pinch PT 03/30/2016, 5:52 PM  [facility] [place], [place], [address] Phone: [phone]   Fax:  [phone]  Name: Victor Brown MRN: [id number] Date of Birth: February 02, 1966  PHYSICAL THERAPY DISCHARGE SUMMARY  Visits from Start of Care: 11  Current functional level related to goals / functional outcomes: See above   Remaining deficits: Pain and hand locking up at work    Education / Equipment: HEP Plan: Patient agrees to discharge.  Patient goals were partially met. Patient is being discharged due to the physician's request.  ?????    Jeral Pinch, PT 03/30/16 5:53 PM

## 2016-05-14 DIAGNOSIS — G4733 Obstructive sleep apnea (adult) (pediatric): Secondary | ICD-10-CM | POA: Insufficient documentation

## 2016-05-14 DIAGNOSIS — E66813 Obesity, class 3: Secondary | ICD-10-CM | POA: Insufficient documentation

## 2017-02-01 DIAGNOSIS — E781 Pure hyperglyceridemia: Secondary | ICD-10-CM | POA: Insufficient documentation

## 2017-06-20 ENCOUNTER — Ambulatory Visit (INDEPENDENT_AMBULATORY_CARE_PROVIDER_SITE_OTHER): Payer: Managed Care, Other (non HMO) | Admitting: Rehabilitative and Restorative Service Providers"

## 2017-06-20 ENCOUNTER — Encounter: Payer: Self-pay | Admitting: Rehabilitative and Restorative Service Providers"

## 2017-06-20 DIAGNOSIS — M79672 Pain in left foot: Secondary | ICD-10-CM | POA: Diagnosis not present

## 2017-06-20 DIAGNOSIS — R29898 Other symptoms and signs involving the musculoskeletal system: Secondary | ICD-10-CM | POA: Diagnosis not present

## 2017-06-20 DIAGNOSIS — M25512 Pain in left shoulder: Secondary | ICD-10-CM | POA: Diagnosis not present

## 2017-06-20 DIAGNOSIS — G8929 Other chronic pain: Secondary | ICD-10-CM | POA: Diagnosis not present

## 2017-06-20 DIAGNOSIS — R293 Abnormal posture: Secondary | ICD-10-CM | POA: Diagnosis not present
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
## 2017-06-20 NOTE — Patient Instructions (Addendum)
  Gastroc Stretch    Stand with right foot back, leg straight, forward leg bent. Keeping heel on floor, turned slightly out, lean into wall until stretch is felt in calf. Hold __30__ seconds. Repeat __3__ times per set. Do __3-4__ sessions per day.    Soleus Stretch    Stand with right foot back, both knees bent. Keeping heel on floor, turned slightly out, lean into wall until stretch is felt in lower calf. Hold __30__ seconds. Repeat _3___ times per set. Do __3-4__ sessions per day.    massage with gold ball under arch of foot

## 2017-06-22 ENCOUNTER — Ambulatory Visit (INDEPENDENT_AMBULATORY_CARE_PROVIDER_SITE_OTHER): Payer: Managed Care, Other (non HMO) | Admitting: Physical Therapy

## 2017-06-22 DIAGNOSIS — R29898 Other symptoms and signs involving the musculoskeletal system: Secondary | ICD-10-CM

## 2017-06-22 DIAGNOSIS — M79672 Pain in left foot: Secondary | ICD-10-CM | POA: Diagnosis not present

## 2017-06-22 DIAGNOSIS — G8929 Other chronic pain: Secondary | ICD-10-CM | POA: Diagnosis not present

## 2017-06-22 DIAGNOSIS — R293 Abnormal posture: Secondary | ICD-10-CM | POA: Diagnosis not present

## 2017-06-22 DIAGNOSIS — M25512 Pain in left shoulder: Secondary | ICD-10-CM

## 2017-06-22 NOTE — Therapy (Addendum)
[facility] [address] [place], [place], [address] Phone: [phone]   Fax:  [phone]  Physical Therapy Treatment  Patient Details  Name: Victor RuddyMichael S Butrick MRN: [id number] Date of Birth: [birth year] No Data Recorded  Encounter Date: 06/22/2017  PT End of Session - 06/22/17 1551    Visit Number  2    Number of Visits  12    Date for PT Re-Evaluation  08/03/17    PT Start Time  1535    PT Stop Time  1622    PT Time Calculation (min)  47 min    Activity Tolerance  Patient tolerated treatment well    Behavior During Therapy  [facility] LLPWFL for tasks assessed/performed       Past Medical History:  Diagnosis Date  . 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 Sleep apnea    uses cpap    Past Surgical History:  Procedure Laterality Date  . BACK SURGERY  1999  . CARPAL TUNNEL RELEASE Left 2014  . ELBOW DEBRIDEMENT Right 2002  . KNEE ARTHROSCOPY Bilateral   . MANDIBLE SURGERY N/A 1986  . ROTATOR CUFF REPAIR Right 2002  . ROTATOR CUFF REPAIR Left 2008  . TRIGGER FINGER RELEASE Bilateral 2001-2016   multiple  . TRIGGER FINGER RELEASE Left 08/03/2014   Procedure: LEFT LONG FINGER TRIGGER RELEASE;  Surgeon: Mack Hookavid Thompson, MD;  Location: [facility];  Service: Orthopedics;  Laterality: Left;  . TRIGGER FINGER RELEASE Right 06/22/2015   Procedure: RELEASE TRIGGER FINGER/A-1 PULLEY;  Surgeon: Marcene CorningPeter Dalldorf, MD;  [facility];  Service: Orthopedics;  Laterality: Right;  Bier Block Anesthesia    There were no vitals filed for this visit.  Subjective Assessment - 06/22/17 1545    Subjective  Pt reports he has not had any time to do exercises due to work schedule.  No changes in symptoms since last visit.     Currently in Pain?  Yes    Pain Score  4     Pain Location  Shoulder    Pain Orientation  Left    Pain Descriptors / Indicators  Aching;Sharp    Pain Radiating Towards  up into neck.     Aggravating Factors   certain movement of Lt shouder; pressure down through shoulder     Pain Relieving Factors  nothing    Multiple Pain Sites  Yes    Pain Score  4    Pain Location  Foot    Pain Orientation  Left    Pain Descriptors / Indicators  Aching;Throbbing    Aggravating Factors   worse as day goes on    Pain Relieving Factors  getting off of feet        [facility] Adult PT Treatment/Exercise - 06/22/17 0001      Shoulder Exercises: Stretch   Other Shoulder Stretches  3 position doorway stretch x 30 sec.  Rt/Lt lateral neck flexion x 30 sec x 2 reps each side; levator stretch Lt/Rt.     Other Shoulder Stretches  Lt shoulder ext stretch x 30 sec x 3 reps      Modalities   Modalities  Ultrasound;Moist Heat      Moist Heat Therapy   Number Minutes Moist Heat  12 Minutes before and during US    Moist Heat Location  Shoulder;Cervical      Ultrasound   Ultrasound Location  Lt plantar fascia (calc through arch)  Ultrasound Parameters  100%, 1.2 w/cm2, 8 min     Ultrasound Goals  Pain tightness.       Manual Therapy   Manual Therapy  Soft tissue mobilization    Manual therapy comments  applied biofreeze to bottom of Lt foot and Achilles after [place].     Soft tissue mobilization  STM to Lt plantar fascia to decrease fascial adhesions and pain.       Ankle Exercises: Aerobic   Nustep  L5: (arms/legs) 4 min       Ankle Exercises: Supine   Other Supine Ankle Exercises  ankle circles x 20 reps      Ankle Exercises: Stretches   Soleus Stretch  2 reps;30 seconds    Gastroc Stretch  2 reps;30 seconds      Lt hamstring stretch in supine x 30 sec x 3 reps.  Advised pt to try ice massage/self massage to bottom of Lt foot for pain relief.    PT Long Term Goals - 06/20/17 1725      PT LONG TERM GOAL #1   Title  Decrease tenderness and tightness to palpation through the Lt shoulder 08/03/17    Time  6    Period  Weeks    Status  New      PT LONG TERM GOAL #2   Title  Increased  AROM Lt shoulder 08/03/17    Time  6    Period  Weeks    Status  New      PT LONG TERM GOAL #3   Title  decrease pain Lt shoulder by 50% allowing patient to perform functional and work activities with greater ease 08/03/17    Baseline  -    Time  6    Period  Weeks    Status  New      PT LONG TERM GOAL #4   Title  Improve ankle ROM Lt DF to 8-10 deg 08/03/17    Time  6    Period  Weeks    Status  New      PT LONG TERM GOAL #5   Title  Independent with HEP 08/03/17    Time  6    Period  Weeks    Status  New      Additional Long Term Goals   Additional Long Term Goals  Yes      PT LONG TERM GOAL #6   Title  improve FOTO to </= 16% limitation 08/03/17    Time  6    Period  Weeks    Status  New            Plan - 06/22/17 1628    Clinical Impression Statement  Pt reported some "twinges of pain" after completing gastroc and soleus stretches. He tolerated all other exercises without increase in pain.  Encouraged pt to carve out time to exercise to assist in achieving stated goals.     Rehab Potential  Good    PT Frequency  2x / week    PT Duration  6 weeks    PT Treatment/Interventions  Patient/family education;ADLs/Self Care Home Management;Cryotherapy;Electrical Stimulation;Iontophoresis 4mg /ml Dexamethasone;Moist Heat;Ultrasound;Dry needling;Manual techniques;Neuromuscular re-education;Therapeutic activities;Therapeutic exercise    PT Next Visit Plan  assess response to [place]; add shoulder exercises.  trial ionto to foot.     Consulted and Agree with Plan of Care  Patient       Patient will benefit from skilled therapeutic intervention in order to improve the following  deficits and impairments:  Postural dysfunction, Improper body mechanics, Pain, Increased fascial restricitons, Increased muscle spasms, Decreased mobility, Decreased range of motion, Decreased strength, Decreased activity tolerance  Visit Diagnosis: Chronic left shoulder pain  Other symptoms and signs  involving the musculoskeletal system  Abnormal posture  Left foot pain     Problem List There are no active problems to display for this patient.  Mayer Camel, PTA 06/22/17 4:36 PM  [facility] [place] [address] [place], [place], [address] Phone: [phone]   Fax:  [phone]  Name: ODARIUS DINES MRN: [id number] Date of Birth: 11/10/1965

## 2017-06-25 ENCOUNTER — Encounter: Payer: Self-pay | Admitting: Rehabilitative and Restorative Service Providers"

## 2017-06-25 ENCOUNTER — Ambulatory Visit (INDEPENDENT_AMBULATORY_CARE_PROVIDER_SITE_OTHER): Payer: Managed Care, Other (non HMO) | Admitting: Rehabilitative and Restorative Service Providers"

## 2017-06-25 DIAGNOSIS — G8929 Other chronic pain: Secondary | ICD-10-CM | POA: Diagnosis not present

## 2017-06-25 DIAGNOSIS — R29898 Other symptoms and signs involving the musculoskeletal system: Secondary | ICD-10-CM

## 2017-06-25 DIAGNOSIS — M79672 Pain in left foot: Secondary | ICD-10-CM

## 2017-06-25 DIAGNOSIS — R293 Abnormal posture: Secondary | ICD-10-CM | POA: Diagnosis not present

## 2017-06-25 DIAGNOSIS — M25512 Pain in left shoulder: Secondary | ICD-10-CM | POA: Diagnosis not present

## 2017-06-25 NOTE — Therapy (Signed)
[address] [address] [place], [place], [address] Phone: [phone]   Fax:  [phone]  Physical Therapy Treatment  Patient Details  Name: Victor RuddyMichael S Wan MRN: [id number] Date of Birth: [birth year] No Data Recorded  Encounter Date: 06/25/2017  PT End of Session - 06/25/17 1607    Visit Number  3    Number of Visits  12    Date for PT Re-Evaluation  08/03/17    PT Start Time  1605    PT Stop Time  1655    PT Time Calculation (min)  50 min    Activity Tolerance  Patient tolerated treatment well       Past Medical History:  Diagnosis Date  . 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 Sleep apnea    uses cpap    Past Surgical History:  Procedure Laterality Date  . BACK SURGERY  1999  . CARPAL TUNNEL RELEASE Left 2014  . ELBOW DEBRIDEMENT Right 2002  . KNEE ARTHROSCOPY Bilateral   . MANDIBLE SURGERY N/A 1986  . ROTATOR CUFF REPAIR Right 2002  . ROTATOR CUFF REPAIR Left 2008  . TRIGGER FINGER RELEASE Bilateral 2001-2016   multiple  . TRIGGER FINGER RELEASE Left 08/03/2014   Procedure: LEFT LONG FINGER TRIGGER RELEASE;  Surgeon: Mack Hookavid Thompson, MD;  Location: [facility];  Service: Orthopedics;  Laterality: Left;  . TRIGGER FINGER RELEASE Right 06/22/2015   Procedure: RELEASE TRIGGER FINGER/A-1 PULLEY;  Surgeon: Marcene CorningPeter Dalldorf, MD;  Location: [facility];  Service: Orthopedics;  Laterality: Right;  Bier Block Anesthesia    There were no vitals filed for this visit.  Subjective Assessment - 06/25/17 1608    Subjective  No change. He did some exercises at home and at work. Feels the US helped. Sore Friday by but felt better on Saturday.     Currently in Pain?  Yes    Pain Score  4     Pain Location  Shoulder    Pain Orientation  Left    Pain Descriptors / Indicators  Aching;Sharp    Pain Score  4    Pain Location  Foot    Pain Orientation  Left    Pain Descriptors / Indicators  Aching    Pain Type  Chronic pain                      [facility] Adult PT Treatment/Exercise - 06/25/17 0001      Shoulder Exercises: Pulleys   Flexion  -- 10 reps 10 sec hold Lt       Shoulder Exercises: Stretch   Other Shoulder Stretches  3 position doorway stretch x 30 sec.  Rt/Lt lateral neck flexion x 30 sec x 2 reps each side; levator stretch Lt/Rt.     Other Shoulder Stretches  Lt shoulder ext stretch x 30 sec x 3 reps      Moist Heat Therapy   Number Minutes Moist Heat  15 Minutes during US and manual work Lt foot     Moist Heat Location  Shoulder;Cervical      Ultrasound   Ultrasound Location  Lt plantar surface x 5 min; lt posterior calcaneous x 3 min     Ultrasound Parameters  1.4 w/cm2; 1 mHz; 100%; 8 min     Ultrasound Goals  Pain;Other (Comment) tightness       Manual Therapy   Manual Therapy  Soft tissue mobilization  Soft tissue mobilization  STM/IAST work to Lt plantar fascia to decrease fascial adhesions and pain.       Ankle Exercises: Stretches   Soleus Stretch  2 reps;30 seconds    Gastroc Stretch  2 reps;30 seconds      Ankle Exercises: Standing   SLS  30 sec x 3 each LE hand hold assist as needed for balance       Ankle Exercises: Supine   Other Supine Ankle Exercises  ankle circles x 20 reps                  PT Long Term Goals - 06/25/17 1607      PT LONG TERM GOAL #1   Title  Decrease tenderness and tightness to palpation through the Lt shoulder 08/03/17    Time  6    Period  Weeks    Status  On-going      PT LONG TERM GOAL #2   Title  Increased AROM Lt shoulder 08/03/17    Time  6    Period  Weeks    Status  On-going      PT LONG TERM GOAL #3   Title  decrease pain Lt shoulder by 50% allowing patient to perform functional and work activities with greater ease 08/03/17    Time  6    Period  Weeks    Status  On-going      PT LONG TERM GOAL #4   Title  Improve ankle ROM Lt DF to 8-10 deg 08/03/17    Time  6    Period  Weeks     Status  On-going      PT LONG TERM GOAL #5   Title  Independent with HEP 08/03/17    Time  6    Period  Weeks    Status  On-going      PT LONG TERM GOAL #6   Title  improve FOTO to </= 16% limitation 08/03/17    Time  6    Period  Weeks    Status  On-going            Plan - 06/25/17 1617    Clinical Impression Statement  Continued pain Lt shoulder and Lt foot - no change. Patient reports that he has had some success doing some exercises at home and work.     Rehab Potential  Good    PT Frequency  2x / week    PT Duration  6 weeks    PT Treatment/Interventions  Patient/family education;ADLs/Self Care Home Management;Cryotherapy;Electrical Stimulation;Iontophoresis 4mg /ml Dexamethasone;Moist Heat;Ultrasound;Dry needling;Manual techniques;Neuromuscular re-education;Therapeutic activities;Therapeutic exercise    PT Next Visit Plan   continue [place]; add shoulder exercises.  assess response to ionto to foot.     Consulted and Agree with Plan of Care  Patient       Patient will benefit from skilled therapeutic intervention in order to improve the following deficits and impairments:  Postural dysfunction, Improper body mechanics, Pain, Increased fascial restricitons, Increased muscle spasms, Decreased mobility, Decreased range of motion, Decreased strength, Decreased activity tolerance  Visit Diagnosis: Chronic left shoulder pain  Other symptoms and signs involving the musculoskeletal system  Abnormal posture  Left foot pain     Problem List There are no active problems to display for this patient.   Zoha Spranger Rober Minion PT, MPH  06/25/2017, 4:59 PM  [facility] [address] [place], [place], [address] Phone: [phone]  Fax:  [phone]  Name: RAMAL ECKHARDT MRN: [id number] Date of Birth: 1966/03/29

## 2017-06-28 ENCOUNTER — Ambulatory Visit (INDEPENDENT_AMBULATORY_CARE_PROVIDER_SITE_OTHER): Payer: Managed Care, Other (non HMO) | Admitting: Rehabilitative and Restorative Service Providers"

## 2017-06-28 ENCOUNTER — Encounter: Payer: Self-pay | Admitting: Rehabilitative and Restorative Service Providers"

## 2017-06-28 DIAGNOSIS — M79672 Pain in left foot: Secondary | ICD-10-CM

## 2017-06-28 DIAGNOSIS — G8929 Other chronic pain: Secondary | ICD-10-CM

## 2017-06-28 DIAGNOSIS — R29898 Other symptoms and signs involving the musculoskeletal system: Secondary | ICD-10-CM

## 2017-06-28 DIAGNOSIS — M25512 Pain in left shoulder: Secondary | ICD-10-CM

## 2017-06-28 DIAGNOSIS — R293 Abnormal posture: Secondary | ICD-10-CM | POA: Diagnosis not present

## 2017-06-28 NOTE — Patient Instructions (Signed)
Resisted External Rotation: in Neutral - Bilateral   PALMS UP Sit or stand, tubing in both hands, elbows at sides, bent to 90, forearms forward. Pinch shoulder blades together and rotate forearms out. Keep elbows at sides. Repeat __10__ times per set. Do _2-3___ sets per session. Do _2-3___ sessions per day.   Low Row: Standing   Face anchor, feet shoulder width apart. Palms up, pull arms back, squeezing shoulder blades together. Repeat 10__ times per set. Do 2-3__ sets per session. Do 1__ sessions per day. Anchor Height: Waist   Strengthening: Resisted Extension   Hold tubing in right hand, arm forward. Pull arm back, elbow straight. Repeat _10___ times per set. Do 2-3____ sets per session. Do 1____ sessions per day.

## 2017-06-28 NOTE — Therapy (Signed)
[facility] [place] [address] [place], [place], [address] Phone: [phone]   Fax:  [phone]  Physical Therapy Treatment  Patient Details  Name: Victor Brown MRN: [id number] Date of Birth: January 22, 1966 No data recorded  Encounter Date: 06/28/2017  PT End of Session - 06/28/17 1627    Visit Number  4    Number of Visits  12    Date for PT Re-Evaluation  08/03/17    PT Start Time  1617    PT Stop Time  1711    PT Time Calculation (min)  54 min    Activity Tolerance  Patient tolerated treatment well       Past Medical History:  Diagnosis Date  . 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 Sleep apnea    uses cpap    Past Surgical History:  Procedure Laterality Date  . BACK SURGERY  1999  . CARPAL TUNNEL RELEASE Left 2014  . ELBOW DEBRIDEMENT Right 2002  . KNEE ARTHROSCOPY Bilateral   . MANDIBLE SURGERY N/A 1986  . ROTATOR CUFF REPAIR Right 2002  . ROTATOR CUFF REPAIR Left 2008  . TRIGGER FINGER RELEASE Bilateral 2001-2016   multiple  . TRIGGER FINGER RELEASE Left 08/03/2014   Procedure: LEFT LONG FINGER TRIGGER RELEASE;  Surgeon: Mack Hook, MD;  Location: [facility];  Service: Orthopedics;  Laterality: Left;  . TRIGGER FINGER RELEASE Right 06/22/2015   Procedure: RELEASE TRIGGER FINGER/A-1 PULLEY;  Surgeon: Marcene Corning, MD;  [facility];  Service: Orthopedics;  Laterality: Right;  Bier Block Anesthesia    There were no vitals filed for this visit.  Subjective Assessment - 06/28/17 1628    Subjective  Some better in Lt foot following last treatment. Foot felt better the next day. Pain returned today but not as bad.     Currently in Pain?  Yes    Pain Score  3     Pain Location  Shoulder    Pain Orientation  Left    Pain Descriptors / Indicators  Aching;Dull    Pain Score  4    Pain Location  Foot    Pain Descriptors / Indicators  Aching    Pain Type  Chronic pain                       [facility] Adult PT Treatment/Exercise - 06/28/17 0001      Shoulder Exercises: Standing   Extension  Strengthening;Right;Left;20 reps;Theraband    Theraband Level (Shoulder Extension)  Level 4 (Blue)    Row  Strengthening;Right;Left;20 reps;Theraband    Theraband Level (Shoulder Row)  Level 4 (Blue)    Retraction  Strengthening;Right;Left;20 reps;Theraband    Theraband Level (Shoulder Retraction)  Level 4 (Blue)      Shoulder Exercises: Stretch   Other Shoulder Stretches  3 position doorway stretch x 30 sec.  Rt/Lt lateral neck flexion x 30 sec x 2 reps each side; levator stretch Lt/Rt.       Moist Heat Therapy   Number Minutes Moist Heat  15 Minutes    Moist Heat Location  Shoulder;Cervical heat to shoulder with treatment to foot       Ultrasound   Ultrasound Location  Lt plantar surface    Ultrasound Parameters  1.5 w/cm2; 1 mHz; 100%; 8 min     Ultrasound Goals  Pain;Other (Comment) tightness       Manual Therapy   Manual  Therapy  Soft tissue mobilization    Soft tissue mobilization  STM/IAST work to Lt plantar fascia to decrease fascial adhesions and pain.       Ankle Exercises: Stretches   Soleus Stretch  2 reps;30 seconds    Gastroc Stretch  2 reps;30 seconds      Ankle Exercises: Standing   SLS  30 sec x 3 each LE hand hold assist as needed for balance       Ankle Exercises: Supine   Other Supine Ankle Exercises  ankle circles x 20 reps      Ankle Exercises: Aerobic   Nustep  L5: (legs only) 5 min some hip pain              PT Education - 06/28/17 1642    Education provided  Yes    Education Details  HEP     Person(s) Educated  Patient    Methods  Explanation;Demonstration;Tactile cues;Verbal cues;Handout    Comprehension  Returned demonstration;Verbalized understanding;Verbal cues required;Tactile cues required          PT Long Term Goals - 06/25/17 1607      PT LONG TERM GOAL #1   Title  Decrease tenderness and  tightness to palpation through the Lt shoulder 08/03/17    Time  6    Period  Weeks    Status  On-going      PT LONG TERM GOAL #2   Title  Increased AROM Lt shoulder 08/03/17    Time  6    Period  Weeks    Status  On-going      PT LONG TERM GOAL #3   Title  decrease pain Lt shoulder by 50% allowing patient to perform functional and work activities with greater ease 08/03/17    Time  6    Period  Weeks    Status  On-going      PT LONG TERM GOAL #4   Title  Improve ankle ROM Lt DF to 8-10 deg 08/03/17    Time  6    Period  Weeks    Status  On-going      PT LONG TERM GOAL #5   Title  Independent with HEP 08/03/17    Time  6    Period  Weeks    Status  On-going      PT LONG TERM GOAL #6   Title  improve FOTO to </= 16% limitation 08/03/17    Time  6    Period  Weeks    Status  On-going            Plan - 06/28/17 1630    Clinical Impression Statement  Some improvement in Lt foot pain with last treatment with improvement continuing for 1-2 days. No opportunity to stretch during the day. Works long hours and has no place to stretch.     Rehab Potential  Good    PT Frequency  2x / week    PT Duration  6 weeks    PT Treatment/Interventions  Patient/family education;ADLs/Self Care Home Management;Cryotherapy;Electrical Stimulation;Iontophoresis 4mg /ml Dexamethasone;Moist Heat;Ultrasound;Dry needling;Manual techniques;Neuromuscular re-education;Therapeutic activities;Therapeutic exercise    PT Next Visit Plan   continue US; progress shoulder exercises. continue ionto to foot.     Consulted and Agree with Plan of Care  Patient       Patient will benefit from skilled therapeutic intervention in order to improve the following deficits and impairments:  Postural dysfunction, Improper body mechanics, Pain, Increased fascial restricitons,  Increased muscle spasms, Decreased mobility, Decreased range of motion, Decreased strength, Decreased activity tolerance  Visit Diagnosis: Chronic  left shoulder pain  Other symptoms and signs involving the musculoskeletal system  Abnormal posture  Left foot pain     Problem List There are no active problems to display for this patient.   Kayhan Boardley Rober Minion PT, MPH  06/28/2017, 5:09 PM  [facility] [address] [place], [place], [address] Phone: [phone]   Fax:  [phone]  Name: ETHANIEL GARFIELD MRN: [id number] Date of Birth: Sep 21, 1965

## 2017-07-02 ENCOUNTER — Ambulatory Visit (INDEPENDENT_AMBULATORY_CARE_PROVIDER_SITE_OTHER): Payer: Managed Care, Other (non HMO) | Admitting: Physical Therapy

## 2017-07-02 DIAGNOSIS — R293 Abnormal posture: Secondary | ICD-10-CM | POA: Diagnosis not present

## 2017-07-02 DIAGNOSIS — M25512 Pain in left shoulder: Secondary | ICD-10-CM | POA: Diagnosis not present

## 2017-07-02 DIAGNOSIS — R29898 Other symptoms and signs involving the musculoskeletal system: Secondary | ICD-10-CM

## 2017-07-02 DIAGNOSIS — G8929 Other chronic pain: Secondary | ICD-10-CM

## 2017-07-02 DIAGNOSIS — M79672 Pain in left foot: Secondary | ICD-10-CM | POA: Diagnosis not present

## 2017-07-02 NOTE — Therapy (Signed)
[facility] [place] [address] [place], [place], [address] Phone: [phone]   Fax:  [phone]  Physical Therapy Treatment  Patient Details  Name: Victor Brown MRN: [id number] Date of Birth: 23-Apr-1965 Referring Provider: Dr. Jerl Santos   Encounter Date: 07/02/2017  PT End of Session - 07/02/17 1651    Visit Number  5    Number of Visits  12    Date for PT Re-Evaluation  08/03/17    PT Start Time  1605 pt arrived late    PT Stop Time  1652    PT Time Calculation (min)  47 min       Past Medical History:  Diagnosis Date  . 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 Sleep apnea    uses cpap    Past Surgical History:  Procedure Laterality Date  . BACK SURGERY  1999  . CARPAL TUNNEL RELEASE Left 2014  . ELBOW DEBRIDEMENT Right 2002  . KNEE ARTHROSCOPY Bilateral   . MANDIBLE SURGERY N/A 1986  . ROTATOR CUFF REPAIR Right 2002  . ROTATOR CUFF REPAIR Left 2008  . TRIGGER FINGER RELEASE Bilateral 2001-2016   multiple  . TRIGGER FINGER RELEASE Left 08/03/2014   Procedure: LEFT LONG FINGER TRIGGER RELEASE;  Surgeon: Mack Hook, MD;  Location: [facility];  Service: Orthopedics;  Laterality: Left;  . TRIGGER FINGER RELEASE Right 06/22/2015   Procedure: RELEASE TRIGGER FINGER/A-1 PULLEY;  Surgeon: Marcene Corning, MD;  Location: [facility];  Service: Orthopedics;  Laterality: Right;  Bier Block Anesthesia    There were no vitals filed for this visit.  Subjective Assessment - 07/02/17 1609    Subjective  Pt reports his Lt shoulder is feeling better today, but his Lt neck and Lt foot is sore today.  He believes it might be from the way he sleeps (Lt arm propped up or by head).  "My foot felt great this weekend, and I was up on feet all day Saturday, but in different shoes".     Patient Stated Goals  get rid of the pain in Lt foot and shoulder     Currently in Pain?  Yes    Pain Score  3     Pain  Location  Neck    Pain Orientation  Left    Pain Descriptors / Indicators  Aching    Aggravating Factors   sleep position    Pain Relieving Factors  ?    Multiple Pain Sites  Yes    Pain Score  4    Pain Location  Foot    Pain Descriptors / Indicators  Aching;Sore    Aggravating Factors   worse as day goes on    Pain Relieving Factors  getting off of feet.          [facility] PT Assessment - 07/02/17 0001      Assessment   Medical Diagnosis  Chronic Rt shoulder pain; Rt plantar fasciitis     Referring Provider  Dr. Jerl Santos    Onset Date/Surgical Date  02/08/17 Rt sld pain inc in the past 4-6 months; Rt foot pain x2-3 yr    Hand Dominance  Left    Next MD Visit  end of April 2019     Prior Therapy  yes for shoulder here       [facility] Adult PT Treatment/Exercise - 07/02/17 0001      Shoulder Exercises: Standing   External Rotation  Strengthening;Both;10 reps;Theraband 5 sec pause in retraction    Theraband Level (Shoulder External Rotation)  Level 3 (Green)    Extension  Strengthening;Right;Left;20 reps;Theraband    Theraband Level (Shoulder Extension)  Level 4 (Blue)    Row  Strengthening;Right;Left;20 reps;Theraband    Theraband Level (Shoulder Row)  Level 3 (Green) 5 sec pause in retraction      Shoulder Exercises: Stretch   Other Shoulder Stretches  3 position doorway stretch x 30 sec.  Rt/Lt lateral neck flexion x 30 sec x 2 reps each side; levator stretch Lt/Rt.       Moist Heat Therapy   Number Minutes Moist Heat  20 Minutes started when completing [place]    Moist Heat Location  Shoulder;Cervical heat to shoulder with treatment to foot       Ultrasound   Ultrasound Location  Lt plantar surface of foot    Ultrasound Parameters  1.4 w/cm2, 100%, 8 min     Ultrasound Goals  Pain;Other (Comment) tightness       Manual Therapy   Soft tissue mobilization  STM/IAST work to Lt lateral neck and plantar fascia to decrease fascial adhesions and pain.       Ankle Exercises: Stretches    Gastroc Stretch  2 reps;30 seconds      Ankle Exercises: Aerobic   Nustep  L5: (legs only) 5 min - PTA present to discuss progress          PT Long Term Goals - 06/25/17 1607      PT LONG TERM GOAL #1   Title  Decrease tenderness and tightness to palpation through the Lt shoulder 08/03/17    Time  6    Period  Weeks    Status  On-going      PT LONG TERM GOAL #2   Title  Increased AROM Lt shoulder 08/03/17    Time  6    Period  Weeks    Status  On-going      PT LONG TERM GOAL #3   Title  decrease pain Lt shoulder by 50% allowing patient to perform functional and work activities with greater ease 08/03/17    Time  6    Period  Weeks    Status  On-going      PT LONG TERM GOAL #4   Title  Improve ankle ROM Lt DF to 8-10 deg 08/03/17    Time  6    Period  Weeks    Status  On-going      PT LONG TERM GOAL #5   Title  Independent with HEP 08/03/17    Time  6    Period  Weeks    Status  On-going      PT LONG TERM GOAL #6   Title  improve FOTO to </= 16% limitation 08/03/17    Time  6    Period  Weeks    Status  On-going            Plan - 07/02/17 1655    Clinical Impression Statement  Pt has had a positive response to treatment for foot, however prolonged standing increases symptoms.  Pt verbalized that he is trying to complete exercises more frequently.  Pt reported decreased neck and Lt shoulder pain after IASTM/ estim.  Progressing gradually towards goals.     Rehab Potential  Good    PT Frequency  2x / week    PT Duration  6 weeks    PT Treatment/Interventions  Patient/family education;ADLs/Self Care Home Management;Cryotherapy;Electrical Stimulation;Iontophoresis 4mg /ml Dexamethasone;Moist Heat;Ultrasound;Dry needling;Manual techniques;Neuromuscular re-education;Therapeutic activities;Therapeutic exercise    PT Next Visit Plan   continue US; progress shoulder exercises. continue ionto to foot.  Measure DF.   Consulted and Agree with Plan of Care  Patient        Patient will benefit from skilled therapeutic intervention in order to improve the following deficits and impairments:  Postural dysfunction, Improper body mechanics, Pain, Increased fascial restricitons, Increased muscle spasms, Decreased mobility, Decreased range of motion, Decreased strength, Decreased activity tolerance  Visit Diagnosis: Chronic left shoulder pain  Other symptoms and signs involving the musculoskeletal system  Abnormal posture  Left foot pain     Problem List There are no active problems to display for this patient.  Mayer CamelJennifer Carlson-Long, PTA 07/02/17 5:02 PM  [facility] [address] [place], [place], [address] Phone: [phone]   Fax:  [phone]  Name: Bertis RuddyMichael S Muench MRN: [id number] Date of Birth: 16-Mar-1966

## 2017-07-05 ENCOUNTER — Encounter: Payer: Self-pay | Admitting: Rehabilitative and Restorative Service Providers"

## 2017-07-05 ENCOUNTER — Ambulatory Visit (INDEPENDENT_AMBULATORY_CARE_PROVIDER_SITE_OTHER): Payer: Managed Care, Other (non HMO) | Admitting: Rehabilitative and Restorative Service Providers"

## 2017-07-05 DIAGNOSIS — G8929 Other chronic pain: Secondary | ICD-10-CM

## 2017-07-05 DIAGNOSIS — M25512 Pain in left shoulder: Secondary | ICD-10-CM | POA: Diagnosis not present

## 2017-07-05 DIAGNOSIS — M25612 Stiffness of left shoulder, not elsewhere classified: Secondary | ICD-10-CM

## 2017-07-05 DIAGNOSIS — R29898 Other symptoms and signs involving the musculoskeletal system: Secondary | ICD-10-CM | POA: Diagnosis not present

## 2017-07-05 DIAGNOSIS — R293 Abnormal posture: Secondary | ICD-10-CM | POA: Diagnosis not present

## 2017-07-05 DIAGNOSIS — M79672 Pain in left foot: Secondary | ICD-10-CM | POA: Diagnosis not present

## 2017-07-05 NOTE — Therapy (Signed)
[facility] [place] [address] [place], [place], [address] Phone: [phone]   Fax:  [phone]  Physical Therapy Treatment  Patient Details  Name: Victor Brown MRN: [id number] Date of Birth: 09-19-1965 Referring Provider: Dr. Jerl Santos   Encounter Date: 07/05/2017  PT End of Session - 07/05/17 1626    Visit Number  6    Number of Visits  12    Date for PT Re-Evaluation  08/03/17    PT Start Time  1621    PT Stop Time  1714    PT Time Calculation (min)  53 min    Activity Tolerance  Patient tolerated treatment well       Past Medical History:  Diagnosis Date  . 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 Sleep apnea    uses cpap    Past Surgical History:  Procedure Laterality Date  . BACK SURGERY  1999  . CARPAL TUNNEL RELEASE Left 2014  . ELBOW DEBRIDEMENT Right 2002  . KNEE ARTHROSCOPY Bilateral   . MANDIBLE SURGERY N/A 1986  . ROTATOR CUFF REPAIR Right 2002  . ROTATOR CUFF REPAIR Left 2008  . TRIGGER FINGER RELEASE Bilateral 2001-2016   multiple  . TRIGGER FINGER RELEASE Left 08/03/2014   Procedure: LEFT LONG FINGER TRIGGER RELEASE;  Surgeon: Mack Hook, MD;  Location: [facility];  Service: Orthopedics;  Laterality: Left;  . TRIGGER FINGER RELEASE Right 06/22/2015   Procedure: RELEASE TRIGGER FINGER/A-1 PULLEY;  Surgeon: Marcene Corning, MD;  Location: [facility];  Service: Orthopedics;  Laterality: Right;  Bier Block Anesthesia    There were no vitals filed for this visit.  Subjective Assessment - 07/05/17 1629    Subjective  Patient reports that his shoulder has felt some better since last treatment working through the Lt side of his neck. he also feels the Lt foot is making progress.    Currently in Pain?  Yes    Pain Score  4     Pain Location  Neck    Pain Orientation  Left    Pain Descriptors / Indicators  Aching    Pain Type  Chronic pain    Pain Onset  More than a month  ago    Pain Frequency  Constant    Multiple Pain Sites  Yes    Pain Score  3    Pain Location  Foot    Pain Orientation  Left    Pain Descriptors / Indicators  Aching;Sore    Pain Type  Chronic pain                No data recorded       [facility] Adult PT Treatment/Exercise - 07/05/17 0001      Shoulder Exercises: Therapy Ball   Flexion  Right;Left;5 reps stepping under large ball for stretch       Shoulder Exercises: Stretch   Other Shoulder Stretches  3 position doorway stretch x 30 sec.  Rt/Lt lateral neck flexion x 30 sec x 2 reps each side; levator stretch Lt/Rt.     Other Shoulder Stretches  shd flexion under doorway 20-30 sec hold  x2      Moist Heat Therapy   Number Minutes Moist Heat  20 Minutes with [place] and manual work for foot     Moist Heat Location  Shoulder;Cervical heat to shoulder with treatment to foot       Ultrasound   Ultrasound Location  Lt plantar surface    Ultrasound Parameters  1.5 w/cm2; 1 mHz; 100%; 8 min     Ultrasound Goals  Pain;Other (Comment) tightness       Manual Therapy   Soft tissue mobilization  STM/IAST work to Lt lateral neck and plantar fascia to decrease fascial adhesions and pain.       Ankle Exercises: Stretches   Gastroc Stretch  2 reps;30 seconds      Ankle Exercises: Standing   SLS  30 sec x 3 each LE hand hold assist as needed for balance       Ankle Exercises: Supine   Other Supine Ankle Exercises  ankle circles x 20 reps                  PT Long Term Goals - 07/05/17 1627      PT LONG TERM GOAL #1   Title  Decrease tenderness and tightness to palpation through the Lt shoulder 08/03/17    Time  6    Period  Weeks    Status  On-going      PT LONG TERM GOAL #2   Title  Increased AROM Lt shoulder 08/03/17    Time  6    Period  Weeks    Status  On-going      PT LONG TERM GOAL #3   Title  decrease pain Lt shoulder by 50% allowing patient to perform functional and work activities with greater ease  08/03/17    Time  6    Period  Weeks    Status  On-going      PT LONG TERM GOAL #4   Title  Improve ankle ROM Lt DF to 8-10 deg 08/03/17    Time  6    Period  Weeks    Status  On-going      PT LONG TERM GOAL #5   Title  Independent with HEP 08/03/17    Time  6    Period  Weeks    Status  On-going      PT LONG TERM GOAL #6   Title  improve FOTO to </= 16% limitation 08/03/17    Time  6    Period  Weeks    Status  On-going            Plan - 07/05/17 1702    Clinical Impression Statement  Good response to manual work through the lt cervical and upper trap area with patient reporting less shoulder pain and tightness. Patient also reports that he is having less tightness and pain in the Lt foot. progressing well with rehab - gradually.     Rehab Potential  Good    PT Frequency  2x / week    PT Duration  6 weeks    PT Treatment/Interventions  Patient/family education;ADLs/Self Care Home Management;Cryotherapy;Electrical Stimulation;Iontophoresis 4mg /ml Dexamethasone;Moist Heat;Ultrasound;Dry needling;Manual techniques;Neuromuscular re-education;Therapeutic activities;Therapeutic exercise    PT Next Visit Plan   continue [place]; progress shoulder exercises. continue ionto to foot.     Consulted and Agree with Plan of Care  Patient       Patient will benefit from skilled therapeutic intervention in order to improve the following deficits and impairments:  Postural dysfunction, Improper body mechanics, Pain, Increased fascial restricitons, Increased muscle spasms, Decreased mobility, Decreased range of motion, Decreased strength, Decreased activity tolerance  Visit Diagnosis: Chronic left shoulder pain  Other symptoms and signs involving the musculoskeletal system  Abnormal posture  Left foot pain  Stiffness  of shoulder joint, left     Problem List There are no active problems to display for this patient.   Shamel Galyean Rober MinionP Kaylanie Capili PT, MPH  07/05/2017, 5:04 PM  [facility] [address] [place], [place], [address] Phone: [phone]   Fax:  [phone]  Name: Bertis RuddyMichael S Lottman MRN: [id number] Date of Birth: 07-04-1965

## 2017-07-09 ENCOUNTER — Ambulatory Visit (INDEPENDENT_AMBULATORY_CARE_PROVIDER_SITE_OTHER): Payer: Managed Care, Other (non HMO) | Admitting: Physical Therapy

## 2017-07-09 DIAGNOSIS — R29898 Other symptoms and signs involving the musculoskeletal system: Secondary | ICD-10-CM | POA: Diagnosis not present

## 2017-07-09 DIAGNOSIS — R293 Abnormal posture: Secondary | ICD-10-CM

## 2017-07-09 DIAGNOSIS — M25512 Pain in left shoulder: Secondary | ICD-10-CM

## 2017-07-09 DIAGNOSIS — M79672 Pain in left foot: Secondary | ICD-10-CM | POA: Diagnosis not present

## 2017-07-09 DIAGNOSIS — G8929 Other chronic pain: Secondary | ICD-10-CM

## 2017-07-09 NOTE — Therapy (Signed)
[place], [place], [address] Phone: [phone]   Fax:  [phone]  Physical Therapy Treatment  Patient Details  Name: Victor Brown MRN: [id number] Date of Birth: 05-10-1965 Referring Provider: Dr. Rhona Raider   Encounter Date: 07/09/2017  PT End of Session - 07/09/17 1608    Visit Number  7    Number of Visits  12    Date for PT Re-Evaluation  08/03/17    PT Start Time  7824 pt arrived late    PT Stop Time  1650    PT Time Calculation (min)  44 min    Activity Tolerance  Patient tolerated treatment well    Behavior During Therapy  [facility] for tasks assessed/performed       Past Medical History:  Diagnosis Date  . 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 Sleep apnea    uses cpap    Past Surgical History:  Procedure Laterality Date  . BACK SURGERY  1999  . CARPAL TUNNEL RELEASE Left 2014  . ELBOW DEBRIDEMENT Right 2002  . KNEE ARTHROSCOPY Bilateral   . MANDIBLE SURGERY N/A 1986  . ROTATOR CUFF REPAIR Right 2002  . ROTATOR CUFF REPAIR Left 2008  . TRIGGER FINGER RELEASE Bilateral 2001-2016   multiple  . TRIGGER FINGER RELEASE Left 08/03/2014   Procedure: LEFT LONG FINGER TRIGGER RELEASE;  Surgeon: Milly Jakob, MD;  Location: [place];  Service: Orthopedics;  Laterality: Left;  . TRIGGER FINGER RELEASE Right 06/22/2015   Procedure: RELEASE TRIGGER FINGER/A-1 PULLEY;  Surgeon: Melrose Nakayama, MD;  Location: [place];  Service: Orthopedics;  Laterality: Right;  Bier Block Anesthesia    There were no vitals filed for this visit.  Subjective Assessment - 07/09/17 1609    Subjective  Pt reports his Lt foot is feeling 40% better.  His Lt shoulder "feels about the same".  He reports his neck is more mobile.     Patient Stated Goals  get rid of the pain in Lt foot and shoulder     Currently in Pain?  Yes    Pain Score  2     Pain Location  Foot    Pain Orientation   Left    Pain Descriptors / Indicators  Aching    Aggravating Factors   standing     Pain Relieving Factors  rest     Pain Score  4    Pain Descriptors / Indicators  Aching;Sore    Aggravating Factors   sleep position    Pain Relieving Factors  ??         [facility] PT Assessment - 07/09/17 0001      Assessment   Medical Diagnosis  Chronic Rt shoulder pain; Rt plantar fasciitis     Referring Provider  Dr. Rhona Raider    Onset Date/Surgical Date  02/08/17    Hand Dominance  Left    Next MD Visit  end of April 2019     Prior Therapy  yes for shoulder here       AROM   Left Shoulder Flexion  164 Degrees    Left Shoulder Internal Rotation  39 Degrees    Left Shoulder External Rotation  90 Degrees    Left Ankle Dorsiflexion  6      Flexibility   Soft Tissue Assessment /Muscle Length  yes    Hamstrings  LLE 52 deg.  [facility] Adult PT Treatment/Exercise - 07/09/17 0001      Self-Care   Self-Care  Other Self-Care Comments    Other Self-Care Comments   Pt educated in self MFR to Lt neck; pt returned demo with tactile cues.       Shoulder Exercises: Stretch   Other Shoulder Stretches  3 position doorway stretch x 30 sec.  Rt/Lt lateral neck flexion x 30 sec x 2 reps each side; levator stretch Lt/Rt.     Other Shoulder Stretches  shd flexion under doorway 20-30 sec hold  x 3      Moist Heat Therapy   Number Minutes Moist Heat  10 Minutes during foot treatment of [place] and manual    Moist Heat Location  Shoulder Lt      Ultrasound   Ultrasound Location  Lt plantar surface from calc to midfoot.     Ultrasound Parameters  1.4 w/cm2, 100%, 1.56mz, 8 min    Ultrasound Goals  Pain;Other (Comment) tightness       Manual Therapy   Soft tissue mobilization  STM/IAST work to Lt lateral neck and plantar fascia to decrease fascial adhesions and pain.       Ankle Exercises: Stretches   Soleus Stretch  2 reps;30 seconds    Gastroc Stretch  3 reps;30 seconds    Other Stretch  Lt hamstring  stretch in supine with strap x 30 sec x 3 reps.       Ankle Exercises: Aerobic   Nustep  L5: (legs only) 5 min - PTA present to discuss progress                   PT Long Term Goals - 07/09/17 1614      PT LONG TERM GOAL #1   Title  Decrease tenderness and tightness to palpation through the Lt shoulder 08/03/17    Time  6    Period  Weeks    Status  On-going      PT LONG TERM GOAL #2   Title  Increased AROM Lt shoulder 08/03/17    Time  6    Period  Weeks    Status  Partially Met      PT LONG TERM GOAL #3   Title  decrease pain Lt shoulder by 50% allowing patient to perform functional and work activities with greater ease 08/03/17    Time  6    Period  Weeks    Status  On-going pt reports unchanged shoulder pain level      PT LONG TERM GOAL #4   Title  Improve ankle ROM Lt DF to 8-10 deg 08/03/17    Time  6    Period  Weeks    Status  On-going      PT LONG TERM GOAL #5   Title  Independent with HEP 08/03/17    Time  6    Period  Weeks    Status  On-going      PT LONG TERM GOAL #6   Title  improve FOTO to </= 16% limitation 08/03/17    Time  6    Period  Weeks    Status  On-going            Plan - 07/09/17 1642    Clinical Impression Statement  Pt demonstrated improved Lt shoulder and Lt ankle ROM. Lt hamstring remains very tight -  Pt reporting 40% improvement in Lt foot pain. Pt reporting improved compliance with stretches.  Pt progressing gradually towards goals.     Rehab Potential  Good    PT Frequency  2x / week    PT Duration  6 weeks    PT Treatment/Interventions  Patient/family education;ADLs/Self Care Home Management;Cryotherapy;Electrical Stimulation;Iontophoresis 39m/ml Dexamethasone;Moist Heat;Ultrasound;Dry needling;Manual techniques;Neuromuscular re-education;Therapeutic activities;Therapeutic exercise    PT Next Visit Plan   continue [place] progress shoulder exercises. continue ionto to foot.        Patient will benefit from skilled  therapeutic intervention in order to improve the following deficits and impairments:  Postural dysfunction, Improper body mechanics, Pain, Increased fascial restricitons, Increased muscle spasms, Decreased mobility, Decreased range of motion, Decreased strength, Decreased activity tolerance  Visit Diagnosis: Chronic left shoulder pain  Other symptoms and signs involving the musculoskeletal system  Abnormal posture  Left foot pain     Problem List There are no active problems to display for this patient.  JKerin Perna PTA 07/09/17 5:00 PM  [place] [address]Phone: [phone]  Fax:  [phone] Name: MKIEREN ADKISONMRN: [id number] of Birth: [birth year]

## 2017-07-12 ENCOUNTER — Ambulatory Visit (INDEPENDENT_AMBULATORY_CARE_PROVIDER_SITE_OTHER): Payer: Managed Care, Other (non HMO) | Admitting: Rehabilitative and Restorative Service Providers"

## 2017-07-12 DIAGNOSIS — G8929 Other chronic pain: Secondary | ICD-10-CM

## 2017-07-12 DIAGNOSIS — R29898 Other symptoms and signs involving the musculoskeletal system: Secondary | ICD-10-CM

## 2017-07-12 DIAGNOSIS — M79672 Pain in left foot: Secondary | ICD-10-CM | POA: Diagnosis not present

## 2017-07-12 DIAGNOSIS — R293 Abnormal posture: Secondary | ICD-10-CM | POA: Diagnosis not present

## 2017-07-12 DIAGNOSIS — M25512 Pain in left shoulder: Secondary | ICD-10-CM

## 2017-07-12 NOTE — Therapy (Signed)
[place], [place], [address] Phone: [phone]   Fax:  [phone]  Physical Therapy Treatment  Patient Details  Name: Victor Brown MRN: [id number] Date of Birth: 03-01-1966 Referring Provider: Dr. Rhona Raider   Encounter Date: 07/12/2017  PT End of Session - 07/12/17 1627    Visit Number  8    Number of Visits  12    Date for PT Re-Evaluation  08/03/17    PT Start Time  7741    PT Stop Time  1704    PT Time Calculation (min)  49 min    Activity Tolerance  Patient tolerated treatment well       Past Medical History:  Diagnosis Date  . 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 Sleep apnea    uses cpap    Past Surgical History:  Procedure Laterality Date  . BACK SURGERY  1999  . CARPAL TUNNEL RELEASE Left 2014  . ELBOW DEBRIDEMENT Right 2002  . KNEE ARTHROSCOPY Bilateral   . MANDIBLE SURGERY N/A 1986  . ROTATOR CUFF REPAIR Right 2002  . ROTATOR CUFF REPAIR Left 2008  . TRIGGER FINGER RELEASE Bilateral 2001-2016   multiple  . TRIGGER FINGER RELEASE Left 08/03/2014   Procedure: LEFT LONG FINGER TRIGGER RELEASE;  Surgeon: Milly Jakob, MD;  Location: [place];  Service: Orthopedics;  Laterality: Left;  . TRIGGER FINGER RELEASE Right 06/22/2015   Procedure: RELEASE TRIGGER FINGER/A-1 PULLEY;  Surgeon: Melrose Nakayama, MD;  Location: [place];  Service: Orthopedics;  Laterality: Right;  Bier Block Anesthesia    There were no vitals filed for this visit.  Subjective Assessment - 07/12/17 1627    Subjective  Continues to make some progress "a little bit" with the foot. Neck feels looser. Shoulder is the same     Currently in Pain?  Yes    Pain Score  3     Pain Location  Shoulder    Pain Orientation  Left    Pain Descriptors / Indicators  Aching    Pain Type  Chronic pain    Pain Radiating Towards  into the neck     Pain Onset  More than a month ago    Pain  Frequency  Constant    Pain Score  3    Pain Location  Foot    Pain Orientation  Left    Pain Descriptors / Indicators  Aching;Sore rock in the bottom of the shoe     Pain Type  Chronic pain    Pain Onset  More than a month ago    Pain Frequency  Constant                       [facility] Adult PT Treatment/Exercise - 07/12/17 0001      Shoulder Exercises: Stretch   Other Shoulder Stretches  3 position doorway stretch x 30 sec.  Rt/Lt lateral neck flexion x 30 sec x 2 reps each side; levator stretch Lt/Rt.     Other Shoulder Stretches  shd flexion under doorway 20-30 sec hold  x 3      Moist Heat Therapy   Number Minutes Moist Heat  15 Minutes    Moist Heat Location  Shoulder Lt      Ultrasound   Ultrasound Location  Lt plantar surface    Ultrasound Parameters  1.5 w/cm2; 1 mHz; 100%; 8 min  Ultrasound Goals  Pain;Other (Comment) tightness       Manual Therapy   Soft tissue mobilization  STM/IAST work to Lt lateral neck and plantar fascia to decrease fascial adhesions and pain.       Ankle Exercises: Stretches   Soleus Stretch  2 reps;30 seconds    Gastroc Stretch  3 reps;30 seconds    Other Stretch  Lt hamstring stretch in supine with strap x 30 sec x 3 reps.       Ankle Exercises: Aerobic   Nustep  L5: (legs only) 5 min - PTA present to discuss progress       Ankle Exercises: Supine   Other Supine Ankle Exercises  ankle circles x 20 reps                  PT Long Term Goals - 07/09/17 1614      PT LONG TERM GOAL #1   Title  Decrease tenderness and tightness to palpation through the Lt shoulder 08/03/17    Time  6    Period  Weeks    Status  On-going      PT LONG TERM GOAL #2   Title  Increased AROM Lt shoulder 08/03/17    Time  6    Period  Weeks    Status  Partially Met      PT LONG TERM GOAL #3   Title  decrease pain Lt shoulder by 50% allowing patient to perform functional and work activities with greater ease 08/03/17    Time  6     Period  Weeks    Status  On-going pt reports unchanged shoulder pain level      PT LONG TERM GOAL #4   Title  Improve ankle ROM Lt DF to 8-10 deg 08/03/17    Time  6    Period  Weeks    Status  On-going      PT LONG TERM GOAL #5   Title  Independent with HEP 08/03/17    Time  6    Period  Weeks    Status  On-going      PT LONG TERM GOAL #6   Title  improve FOTO to </= 16% limitation 08/03/17    Time  6    Period  Weeks    Status  On-going            Plan - 07/12/17 1705    Clinical Impression Statement  Continued gradual improvement in plantar surface. Shoulder continues to be painful. Neck is not as tight. Patient states that he is doing some of his stretches.     Rehab Potential  Good    PT Frequency  2x / week    PT Duration  6 weeks    PT Treatment/Interventions  Patient/family education;ADLs/Self Care Home Management;Cryotherapy;Electrical Stimulation;Iontophoresis 49m/ml Dexamethasone;Moist Heat;Ultrasound;Dry needling;Manual techniques;Neuromuscular re-education;Therapeutic activities;Therapeutic exercise    PT Next Visit Plan   continue [place] progress shoulder exercises. continue ionto to foot.     Consulted and Agree with Plan of Care  Patient       Patient will benefit from skilled therapeutic intervention in order to improve the following deficits and impairments:  Postural dysfunction, Improper body mechanics, Pain, Increased fascial restricitons, Increased muscle spasms, Decreased mobility, Decreased range of motion, Decreased strength, Decreased activity tolerance  Visit Diagnosis: Chronic left shoulder pain  Other symptoms and signs involving the musculoskeletal system  Abnormal posture  Left foot pain  Problem List There are no active problems to display for this patient.   [place], MPH  07/12/2017, 5:09 PM  [place], [place], [address] Phone:  [phone]   Fax:  [phone]  Name: Victor Brown MRN: [id number] Date of Birth: May 29, 1965

## 2017-07-16 ENCOUNTER — Ambulatory Visit (INDEPENDENT_AMBULATORY_CARE_PROVIDER_SITE_OTHER): Payer: Managed Care, Other (non HMO) | Admitting: Physical Therapy

## 2017-07-16 DIAGNOSIS — R29898 Other symptoms and signs involving the musculoskeletal system: Secondary | ICD-10-CM | POA: Diagnosis not present

## 2017-07-16 DIAGNOSIS — M79672 Pain in left foot: Secondary | ICD-10-CM | POA: Diagnosis not present

## 2017-07-16 DIAGNOSIS — R293 Abnormal posture: Secondary | ICD-10-CM | POA: Diagnosis not present

## 2017-07-16 DIAGNOSIS — G8929 Other chronic pain: Secondary | ICD-10-CM | POA: Diagnosis not present

## 2017-07-16 DIAGNOSIS — M25512 Pain in left shoulder: Secondary | ICD-10-CM

## 2017-07-16 NOTE — Therapy (Signed)
[place], [place], [address] Phone: [phone]   Fax:  [phone]  Physical Therapy Treatment  Patient Details  Name: Victor Brown MRN: [id number] Date of Birth: 23-Jul-1965 Referring Provider: Dr. Rhona Raider   Encounter Date: 07/16/2017  PT End of Session - 07/16/17 1649    Visit Number  9    Number of Visits  12    Date for PT Re-Evaluation  08/03/17    PT Start Time  1602    PT Stop Time  1655    PT Time Calculation (min)  53 min    Activity Tolerance  Patient tolerated treatment well    Behavior During Therapy  [facility] for tasks assessed/performed       Past Medical History:  Diagnosis Date  . 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 Sleep apnea    uses cpap    Past Surgical History:  Procedure Laterality Date  . BACK SURGERY  1999  . CARPAL TUNNEL RELEASE Left 2014  . ELBOW DEBRIDEMENT Right 2002  . KNEE ARTHROSCOPY Bilateral   . MANDIBLE SURGERY N/A 1986  . ROTATOR CUFF REPAIR Right 2002  . ROTATOR CUFF REPAIR Left 2008  . TRIGGER FINGER RELEASE Bilateral 2001-2016   multiple  . TRIGGER FINGER RELEASE Left 08/03/2014   Procedure: LEFT LONG FINGER TRIGGER RELEASE;  Surgeon: Milly Jakob, MD;  Location: [place];  Service: Orthopedics;  Laterality: Left;  . TRIGGER FINGER RELEASE Right 06/22/2015   Procedure: RELEASE TRIGGER FINGER/A-1 PULLEY;  Surgeon: Melrose Nakayama, MD;  Location: [place];  Service: Orthopedics;  Laterality: Right;  Bier Block Anesthesia    There were no vitals filed for this visit.  Subjective Assessment - 07/16/17 1604    Subjective  Worked an extra day on Saturday. Foot and neck "feel okay," but shoulder feels "more," pt unable to describe feeling. Pain elicited with bilateral cervical rotation.  He reports he is trying to do stretches at work and band exercises every day.     Currently in Pain?  Yes    Pain Score  4     Pain  Location  Neck    Pain Orientation  Left    Pain Descriptors / Indicators  Sharp    Multiple Pain Sites  Yes    Pain Score  3    Pain Location  Foot    Pain Orientation  Left    Pain Descriptors / Indicators  Aching    Pain Score  3    Pain Location  Shoulder    Pain Orientation  Left    Pain Descriptors / Indicators  Aching         [facility] PT Assessment - 07/16/17 0001      AROM   Left Ankle Dorsiflexion  5, PROM (supine, pulling foot with strap) 9 deg.      Strength   Overall Strength Comments  -- LUE: flex, ER 5/5, no pain, 5-/5 lower trap with pain.      Flexibility   Hamstrings  LLE 50; RLE  60 deg          [facility] Adult PT Treatment/Exercise - 07/16/17 0001      Shoulder Exercises: Supine   Other Supine Exercises  LUE stretch with arm off of edge of bed in angles of flexion, ER, horz abdct.  - no pain.       Shoulder Exercises: Prone   Flexion  Left;10 reps;Weights;Limitations    Flexion Weight (lbs)  1    Flexion Limitations  pt reported pain in sternum with prone position.       Shoulder Exercises: Standing   Flexion  Strengthening;Left;10 reps;Weights 4# 90-150 deg flexion    Other Standing Exercises  D2 flexion with green band x 10 reps, cues for hand position and neutral neck.        Shoulder Exercises: Stretch   Other Shoulder Stretches  mid level door stretch x 30 sec x 3 reps; overhead flexion with hands on top of door x 30 sec x 3 reps        Modalities   Modalities  Iontophoresis;Ultrasound      Moist Heat Therapy   Number Minutes Moist Heat  10 Minutes while receiving STM to foot    Moist Heat Location  Cervical;Shoulder      Iontophoresis   Type of Iontophoresis  Dexamethasone    Location  Lt plantar surface at calcaneus    Dose  1.0 cc    Time  120 mA, 12 hrs      Manual Therapy   Soft tissue mobilization  STM/IAST work to Lt lateral neck, upper trap and plantar fascia to decrease fascial adhesions and pain.       Ankle Exercises: Aerobic    Nustep  -- machine unavailable      Ankle Exercises: Stretches   Soleus Stretch  2 reps;30 seconds    Gastroc Stretch  3 reps;30 seconds    Other Stretch  Lt/Rt hamstring stretch in supine with strap x 30 sec x 3 reps.              PT Education - 07/16/17 1650    Education provided  Yes    Education Details  HEP    Person(s) Educated  Patient    Methods  Explanation;Handout;Demonstration;Verbal cues    Comprehension  Verbalized understanding;Returned demonstration          PT Long Term Goals - 07/16/17 1618      PT LONG TERM GOAL #1   Title  Decrease tenderness and tightness to palpation through the Lt shoulder 08/03/17    Time  6    Period  Weeks    Status  On-going      PT LONG TERM GOAL #2   Title  Increased AROM Lt shoulder 08/03/17    Time  6    Period  Weeks    Status  Partially Met      PT LONG TERM GOAL #3   Title  decrease pain Lt shoulder by 50% allowing patient to perform functional and work activities with greater ease 08/03/17    Time  6    Period  Weeks    Status  -- pain unchanged - 07/16/17      PT LONG TERM GOAL #4   Title  Improve ankle ROM Lt DF to 8-10 deg 08/03/17    Time  6    Period  Weeks    Status  Partially Met PROM to 9 deg, AROM to 5 deg.       PT LONG TERM GOAL #5   Title  Independent with HEP 08/03/17      PT LONG TERM GOAL #6   Title  improve FOTO to </= 16% limitation 08/03/17    Time  6    Period  Weeks    Status  On-going  Plan - 07/16/17 1614    Clinical Impression Statement  Pt is reporting 50% improvement in Lt foot pain since initiating therapy, Lt shoulder pain unchanged.  Pt's Lt shoulder strength has improved; Lt lower trap 5-/5 with pain.  Pt given D2 flexion exercise to help address this.  Pt progressing gradually towards remaining goals.     Rehab Potential  Good    PT Frequency  2x / week    PT Duration  6 weeks    PT Treatment/Interventions  Patient/family education;ADLs/Self Care Home  Management;Cryotherapy;Electrical Stimulation;Iontophoresis 47m/ml Dexamethasone;Moist Heat;Ultrasound;Dry needling;Manual techniques;Neuromuscular re-education;Therapeutic activities;Therapeutic exercise    PT Next Visit Plan  assess response to Lt shoulder HEP.  continue ionto and manual therapy to Lt foot and shoulder/neck.      Consulted and Agree with Plan of Care  Patient       Patient will benefit from skilled therapeutic intervention in order to improve the following deficits and impairments:  Postural dysfunction, Improper body mechanics, Pain, Increased fascial restricitons, Increased muscle spasms, Decreased mobility, Decreased range of motion, Decreased strength, Decreased activity tolerance  Visit Diagnosis: Chronic left shoulder pain  Other symptoms and signs involving the musculoskeletal system  Abnormal posture  Left foot pain     Problem List There are no active problems to display for this patient.  JKerin Perna PTA 07/16/17 5:03 PM  [facility] [place] [address]Phone: [phone]  Fax:  [phone] Name: Victor LEYHMRN: [id number] of Birth: [birth year]

## 2017-07-16 NOTE — Patient Instructions (Signed)
PNF Strengthening: Resisted    Standing with resistive band around each hand, bring right arm up and away, thumb back. GREEN BAND Repeat _20___ times per set. Do _1___ sets per session. Do __1__ sessions per day.   [facility] [address] [place], [place] [address]  [phone] (office) [phone] (fax)

## 2017-07-19 ENCOUNTER — Encounter: Payer: Self-pay | Admitting: Rehabilitative and Restorative Service Providers"

## 2017-07-19 ENCOUNTER — Ambulatory Visit (INDEPENDENT_AMBULATORY_CARE_PROVIDER_SITE_OTHER): Payer: Managed Care, Other (non HMO) | Admitting: Rehabilitative and Restorative Service Providers"

## 2017-07-19 DIAGNOSIS — R293 Abnormal posture: Secondary | ICD-10-CM | POA: Diagnosis not present

## 2017-07-19 DIAGNOSIS — G8929 Other chronic pain: Secondary | ICD-10-CM | POA: Diagnosis not present

## 2017-07-19 DIAGNOSIS — M25612 Stiffness of left shoulder, not elsewhere classified: Secondary | ICD-10-CM | POA: Diagnosis not present

## 2017-07-19 DIAGNOSIS — R29898 Other symptoms and signs involving the musculoskeletal system: Secondary | ICD-10-CM

## 2017-07-19 DIAGNOSIS — M25512 Pain in left shoulder: Secondary | ICD-10-CM

## 2017-07-19 DIAGNOSIS — M79672 Pain in left foot: Secondary | ICD-10-CM | POA: Diagnosis not present

## 2017-07-19 NOTE — Therapy (Signed)
[place], [place], [address] Phone: [phone]   Fax:  [phone]  Physical Therapy Treatment  Patient Details  Name: Victor Brown MRN: [id number] Date of Birth: Sep 04, 1965 Referring Provider: Dr. Rhona Raider   Encounter Date: 07/19/2017  PT End of Session - 07/19/17 1625    Visit Number  10    Number of Visits  12    Date for PT Re-Evaluation  08/03/17    PT Start Time  5366    PT Stop Time  1708    PT Time Calculation (min)  53 min    Activity Tolerance  Patient tolerated treatment well       Past Medical History:  Diagnosis Date  . 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 Sleep apnea    uses cpap    Past Surgical History:  Procedure Laterality Date  . BACK SURGERY  1999  . CARPAL TUNNEL RELEASE Left 2014  . ELBOW DEBRIDEMENT Right 2002  . KNEE ARTHROSCOPY Bilateral   . MANDIBLE SURGERY N/A 1986  . ROTATOR CUFF REPAIR Right 2002  . ROTATOR CUFF REPAIR Left 2008  . TRIGGER FINGER RELEASE Bilateral 2001-2016   multiple  . TRIGGER FINGER RELEASE Left 08/03/2014   Procedure: LEFT LONG FINGER TRIGGER RELEASE;  Surgeon: Milly Jakob, MD;  Location: [place];  Service: Orthopedics;  Laterality: Left;  . TRIGGER FINGER RELEASE Right 06/22/2015   Procedure: RELEASE TRIGGER FINGER/A-1 PULLEY;  Surgeon: Melrose Nakayama, MD;  Location: [place];  Service: Orthopedics;  Laterality: Right;  Bier Block Anesthesia    There were no vitals filed for this visit.  Subjective Assessment - 07/19/17 1625    Subjective  Shoulder and foot has felt good this week. Monday and Tuesday he "felt great". 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 He awoke this moring with pain in the Lt neck area.    Currently in Pain?  Yes    Pain Score  4  this am 6/10    Pain Location  Neck    Pain Orientation  Left;Posterior;Lateral    Pain Descriptors /  Indicators  Sharp    Pain Type  Chronic pain    Pain Onset  More than a month ago    Pain Frequency  Constant    Aggravating Factors   moving or turning head and neck    Pain Relieving Factors  OTC antiinflammatory some help     Pain Score  2    Pain Location  Foot    Pain Orientation  Left    Pain Descriptors / Indicators  Aching    Aggravating Factors   standing walking     Pain Score  3    Pain Location  Shoulder    Pain Orientation  Left    Pain Descriptors / Indicators  Aching                       [facility] Adult PT Treatment/Exercise - 07/19/17 0001      Shoulder Exercises: Stretch   Other Shoulder Stretches  3 way door stretch x 30 sec x 3 reps; overhead flexion with hands on top of door x 30 sec x 3 reps      Other Shoulder Stretches  lateral cervical flexion to Rt 10-15 sec hold x 5       Modalities   Modalities  Iontophoresis;Ultrasound      Moist Heat Therapy   Number Minutes Moist Heat  15 Minutes during US/manual work for foot     Moist Heat Location  Cervical;Shoulder      Ultrasound   Ultrasound Location  Lt plantar surface; Lt posteriorlateral cervical musculature     Ultrasound Parameters  1.5 w/cm2; 1 mHz; 100%; 7 min Lt plantar surface; 7 min lateral cervical     Ultrasound Goals  Pain;Other (Comment)      Iontophoresis   Type of Iontophoresis  Dexamethasone    Location  Lt plantar surface at calcaneus    Dose  1.0 cc    Time  120 mA, 12 hrs      Manual Therapy   Soft tissue mobilization  STM/IAST work to Lt lateral neck and plantar fascia to decrease fascial adhesions and pain.       Ankle Exercises: Aerobic   Nustep  L5: (legs only) 5 min - PT present to discuss progress       Ankle Exercises: Stretches   Soleus Stretch  2 reps;30 seconds    Gastroc Stretch  3 reps;30 seconds    Other Stretch  Lt/Rt hamstring stretch in supine with strap x 30 sec x 3 reps.                   PT Long Term Goals - 07/16/17 1618      PT  LONG TERM GOAL #1   Title  Decrease tenderness and tightness to palpation through the Lt shoulder 08/03/17    Time  6    Period  Weeks    Status  On-going      PT LONG TERM GOAL #2   Title  Increased AROM Lt shoulder 08/03/17    Time  6    Period  Weeks    Status  Partially Met      PT LONG TERM GOAL #3   Title  decrease pain Lt shoulder by 50% allowing patient to perform functional and work activities with greater ease 08/03/17    Time  6    Period  Weeks    Status  -- pain unchanged - 07/16/17      PT LONG TERM GOAL #4   Title  Improve ankle ROM Lt DF to 8-10 deg 08/03/17    Time  6    Period  Weeks    Status  Partially Met PROM to 9 deg, AROM to 5 deg.       PT LONG TERM GOAL #5   Title  Independent with HEP 08/03/17      PT LONG TERM GOAL #6   Title  improve FOTO to </= 16% limitation 08/03/17    Time  6    Period  Weeks    Status  On-going            Plan - 07/19/17 1750    Clinical Impression Statement  Continued improvement in Lt plantar surface/foot pain; Lt shoulder has felt good for the past 2-3 days as well. Patient reports that his Lt neck is hurting and tight today - perhaps from sleeping in an awkward position all night. Patient reports improvement with manual work, [place], stretching post treatment today. Encouraged patient to assess sleeping postures and avoid awkward positions.     Rehab Potential  Good    PT Frequency  2x / week    PT Duration  6 weeks    PT Treatment/Interventions  Patient/family  education;ADLs/Self Care Home Management;Cryotherapy;Electrical Stimulation;Iontophoresis 80m/ml Dexamethasone;Moist Heat;Ultrasound;Dry needling;Manual techniques;Neuromuscular re-education;Therapeutic activities;Therapeutic exercise    PT Next Visit Plan  assess response to Lt shoulder HEP.  continue ionto and manual therapy to Lt foot and shoulder/neck.  Assess response to [place] work for increased cervical pain     Consulted and Agree with Plan of Care   Patient       Patient will benefit from skilled therapeutic intervention in order to improve the following deficits and impairments:  Postural dysfunction, Improper body mechanics, Pain, Increased fascial restricitons, Increased muscle spasms, Decreased mobility, Decreased range of motion, Decreased strength, Decreased activity tolerance  Visit Diagnosis: Chronic left shoulder pain  Other symptoms and signs involving the musculoskeletal system  Abnormal posture  Left foot pain  Stiffness of shoulder joint, left     Problem List There are no active problems to display for this patient.   CNicholson MPH  07/19/2017, 5:58 PM  [facility] [address]Phone: [phone]  Fax:  [phone] Name: Victor SARACENIMRN: [id number] of Birth: [birth year]

## 2017-07-23 ENCOUNTER — Encounter: Payer: Self-pay | Admitting: Rehabilitative and Restorative Service Providers"

## 2017-07-23 ENCOUNTER — Ambulatory Visit (INDEPENDENT_AMBULATORY_CARE_PROVIDER_SITE_OTHER): Payer: Managed Care, Other (non HMO) | Admitting: Rehabilitative and Restorative Service Providers"

## 2017-07-23 DIAGNOSIS — M79672 Pain in left foot: Secondary | ICD-10-CM

## 2017-07-23 DIAGNOSIS — M25512 Pain in left shoulder: Secondary | ICD-10-CM | POA: Diagnosis not present

## 2017-07-23 DIAGNOSIS — R29898 Other symptoms and signs involving the musculoskeletal system: Secondary | ICD-10-CM | POA: Diagnosis not present

## 2017-07-23 DIAGNOSIS — G8929 Other chronic pain: Secondary | ICD-10-CM

## 2017-07-23 DIAGNOSIS — R293 Abnormal posture: Secondary | ICD-10-CM

## 2017-07-23 NOTE — Therapy (Addendum)
[place], [place], [address] Phone: [phone]   Fax:  [phone]  Physical Therapy Treatment  Patient Details  Name: Victor Brown MRN: [id number] Date of Birth: Dec 01, 1965 Referring Provider: Dr Rhona Raider    Encounter Date: 07/23/2017  PT End of Session - 07/23/17 1615    Visit Number  11    Number of Visits  12    Date for PT Re-Evaluation  08/03/17    PT Start Time  4287    PT Stop Time  1656    PT Time Calculation (min)  42 min    Activity Tolerance  Patient tolerated treatment well       Past Medical History:  Diagnosis Date  . 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 Sleep apnea    uses cpap    Past Surgical History:  Procedure Laterality Date  . BACK SURGERY  1999  . CARPAL TUNNEL RELEASE Left 2014  . ELBOW DEBRIDEMENT Right 2002  . KNEE ARTHROSCOPY Bilateral   . MANDIBLE SURGERY N/A 1986  . ROTATOR CUFF REPAIR Right 2002  . ROTATOR CUFF REPAIR Left 2008  . TRIGGER FINGER RELEASE Bilateral 2001-2016   multiple  . TRIGGER FINGER RELEASE Left 08/03/2014   Procedure: LEFT LONG FINGER TRIGGER RELEASE;  Surgeon: Milly Jakob, MD;  Location: [place];  Service: Orthopedics;  Laterality: Left;  . TRIGGER FINGER RELEASE Right 06/22/2015   Procedure: RELEASE TRIGGER FINGER/A-1 PULLEY;  Surgeon: Melrose Nakayama, MD;  Location: [facility];  Service: Orthopedics;  Laterality: Right;  Bier Block Anesthesia    There were no vitals filed for this visit.  Subjective Assessment - 07/23/17 1616    Subjective  Neck is improved from last week. Shoulder is some better but continues to hurt some on a daily basis. Lt foot is actually doing better. He is having less pain in the Lt foot with functional activities.     Currently in Pain?  Yes    Pain Score  2     Pain Location  Neck    Pain Orientation  Left;Posterior;Lateral    Pain Descriptors /  Indicators  Tightness;Sharp    Pain Radiating Towards  Lt lateral neck area     Pain Onset  1 to 4 weeks ago    Pain Frequency  Intermittent    Aggravating Factors   moving or turning head     Multiple Pain Sites  Yes    Pain Score  2    Pain Location  Foot    Pain Orientation  Left    Pain Descriptors / Indicators  Aching    Pain Type  Chronic pain    Pain Radiating Towards  toes to heel less through back of calf     Pain Onset  More than a month ago    Pain Frequency  Intermittent    Aggravating Factors   standing or walking     Pain Score  2    Pain Location  Shoulder    Pain Orientation  Left    Pain Descriptors / Indicators  Aching    Pain Type  Chronic pain    Pain Radiating Towards  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     Pain Onset  More than a month ago    Pain Frequency  Constant    Aggravating Factors   work     Pain Relieving Factors  nothing          [facility] PT Assessment - 07/23/17 0001      Assessment   Medical Diagnosis  Chronic Rt shoulder pain; Rt plantar fasciitis     Referring Provider  Dr Rhona Raider     Onset Date/Surgical Date  02/08/17    Hand Dominance  Left    Next MD Visit  end of April 2019     Prior Therapy  yes for shoulder here       Posture/Postural Control   Posture Comments  head forward; shoulders rounded and elevated; increased thoracic kyphosis; scapulae abducted and rotated along the thoracic wall; head of the humerus anterior in orientatioin; UE's in IR at sides; LE's in ER in standing       AROM   Right Shoulder Extension  51 Degrees    Right Shoulder Flexion  168 Degrees    Right Shoulder ABduction  160 Degrees    Right Shoulder Internal Rotation  40 Degrees    Right Shoulder External Rotation  90 Degrees    Left Shoulder Extension  56 Degrees    Left Shoulder Flexion  165 Degrees    Left Shoulder ABduction  166 Degrees    Left Shoulder Internal Rotation  39 Degrees    Left Shoulder  External Rotation  90 Degrees    Right Ankle Dorsiflexion  10    Left Ankle Dorsiflexion  6      Strength   Overall Strength Comments  -- LUE: flex, ER 5/5, no pain, 5-/5 lower trap with pain.      Flexibility   Hamstrings  LLE 50; RLE  60 deg                   [facility] Adult PT Treatment/Exercise - 07/23/17 0001      Shoulder Exercises: Stretch   Other Shoulder Stretches  3 way door stretch x 30 sec x 3 reps; overhead flexion with hands on top of door x 30 sec x 3 reps      Other Shoulder Stretches  lateral cervical flexion to Rt 10-15 sec hold x 5       Modalities   Modalities  Iontophoresis;Ultrasound      Ultrasound   Ultrasound Location  Lt plantar surface; Lt lateral cervical musculature     Ultrasound Parameters  1.5 w/cm2; 1 mHz; 100%; 7 min each area     Ultrasound Goals  Pain;Other (Comment)      Iontophoresis   Type of Iontophoresis  Dexamethasone    Location  Lt plantar surface at calcaneus    Dose  1.0 cc    Time  120 mA, 12 hrs      Manual Therapy   Soft tissue mobilization  STM/IAST work to Lt lateral neck and plantar fascia to decrease fascial adhesions and pain.       Ankle Exercises: Aerobic   Nustep  L5: (legs only) 5 min - PT present to discuss progress       Ankle Exercises: Stretches   Soleus Stretch  2 reps;30 seconds    Gastroc Stretch  3 reps;30 seconds    Other Stretch  Lt/Rt hamstring stretch in supine with strap x 30 sec x 3 reps.                   PT Long Term Goals - 07/23/17 1706  PT LONG TERM GOAL #1   Title  Decrease tenderness and tightness to palpation through the Lt shoulder 08/03/17    Time  6    Period  Weeks    Status  Partially Met      PT LONG TERM GOAL #2   Title  Increased AROM Lt shoulder 08/03/17    Time  6    Period  Weeks    Status  Achieved      PT LONG TERM GOAL #3   Title  decrease pain Lt shoulder by 50% allowing patient to perform functional and work activities with greater ease  08/03/17    Time  6    Period  Weeks    Status  Not Met minimal change in shoulder pain       PT LONG TERM GOAL #4   Title  Improve ankle ROM Lt DF to 8-10 deg 08/03/17    Time  6    Period  Weeks    Status  Partially Met      PT LONG TERM GOAL #5   Title  Independent with HEP 08/03/17    Time  6    Period  Weeks    Status  Partially Met      PT LONG TERM GOAL #6   Title  improve FOTO to </= 16% limitation 08/03/17    Time  6    Period  Weeks    Status  On-going            Plan - 07/23/17 1659    Clinical Impression Statement  Victor Brown reports some improvement in pain in the Lt lateral cervical area, Lt shoulder and Lt foot. Pain is rated at ~ 2/10 in all areas today. Patient reports that he is doing some of the exercises at home but is often too tired following work to do his exercises. 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 He holds his Lt UE in elevation and IR for prolonged periods of time and for many hours each day during his 50-60 hour work weeks. Shoulder symptoms will likely continue with this type work requirements. Patent does demonstrate improvement in the Lt plantar fasciitis. He has some Lt lateral cervical tightness and discomfort. Goals of therapy were partially accomplished.     Rehab Potential  Good    PT Frequency  2x / week    PT Duration  6 weeks    PT Treatment/Interventions  Patient/family education;ADLs/Self Care Home Management;Cryotherapy;Electrical Stimulation;Iontophoresis 43m/ml Dexamethasone;Moist Heat;Ultrasound;Dry needling;Manual techniques;Neuromuscular re-education;Therapeutic activities;Therapeutic exercise    PT Next Visit Plan  note to MD - continue as ordered     Consulted and Agree with Plan of Care  Patient       Patient will benefit from skilled therapeutic intervention in order to improve the following deficits and impairments:  Postural dysfunction, Improper body mechanics, Pain, Increased fascial restricitons, Increased muscle spasms,  Decreased mobility, Decreased range of motion, Decreased strength, Decreased activity tolerance  Visit Diagnosis: Chronic left shoulder pain  Other symptoms and signs involving the musculoskeletal system  Abnormal posture  Left foot pain     Problem List There are no active problems to display for this patient.   CMokuleia MPH  07/23/2017, 5:09 PM  [facility] [address]Phone: [phone]  Fax:  [phone] Name: Victor BARRISMRN: [id number] of Birth: [birth year] PHYSICAL THERAPY DISCHARGE  SUMMARY  Visits from Start of Care: 11  Current functional level related to goals / functional outcomes: See last progress note for discharge status    Remaining deficits: Unknown    Education / Equipment: HEP work modifications recommended  Plan: Patient agrees to discharge.  Patient goals were partially met. Patient is being discharged due to not returning since the last visit.  ?????     Juda Toepfer P. Helene Kelp PT, MPH 08/16/17 11:36 AM

## 2017-07-26 ENCOUNTER — Encounter: Payer: Managed Care, Other (non HMO) | Admitting: Rehabilitative and Restorative Service Providers"

## 2020-01-26 ENCOUNTER — Emergency Department (INDEPENDENT_AMBULATORY_CARE_PROVIDER_SITE_OTHER)
Admission: EM | Admit: 2020-01-26 | Discharge: 2020-01-26 | Disposition: A | Discharge status: Home / Self Care | Payer: PRIVATE HEALTH INSURANCE | Source: Home / Self Care

## 2020-01-26 ENCOUNTER — Other Ambulatory Visit: Payer: Self-pay

## 2020-01-26 ENCOUNTER — Emergency Department (INDEPENDENT_AMBULATORY_CARE_PROVIDER_SITE_OTHER): Payer: PRIVATE HEALTH INSURANCE

## 2020-01-26 DIAGNOSIS — M79641 Pain in right hand: Secondary | ICD-10-CM

## 2020-01-26 DIAGNOSIS — Z8739 Personal history of other diseases of the musculoskeletal system and connective tissue: Secondary | ICD-10-CM

## 2020-01-26 DIAGNOSIS — M7989 Other specified soft tissue disorders: Secondary | ICD-10-CM

## 2020-01-26 MED ORDER — MELOXICAM 15 MG PO TABS: 15.0000 mg | ORAL_TABLET | Freq: Every day | ORAL | 0 refills | Status: DC

## 2020-01-26 NOTE — ED Provider Notes (Signed)
[facility]    CSN: [id number] Arrival date & time: 01/26/20  1618      History   Chief Complaint Chief Complaint  Patient presents with  . Hand Pain    and swelling    HPI [name] is a 54 y.o. male.   HPI  Victor Brown is a 54 y.o. male presenting to UC with c/o Right hand pain and swelling that started about 10 days ago. 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 States he may have hit his hand on something but does not recall.  He also reports having a trigger finger release in 2004.  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 No hx of gout. Pain is aching and sore, 3/10, worse with movement. Right hand dominant.    Past Medical History:  Diagnosis Date  . 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 Sleep apnea    uses cpap    There are no problems to display for this patient.   Past Surgical History:  Procedure Laterality Date  . BACK SURGERY  1999  . CARPAL TUNNEL RELEASE Left 2014  . ELBOW DEBRIDEMENT Right 2002  . KNEE ARTHROSCOPY Bilateral   . MANDIBLE SURGERY N/A 1986  . ROTATOR CUFF REPAIR Right 2002  . ROTATOR CUFF REPAIR Left 2008  . TRIGGER FINGER RELEASE Bilateral 2001-2016   multiple  . TRIGGER FINGER RELEASE Left 08/03/2014   Procedure: LEFT LONG FINGER TRIGGER RELEASE;  Surgeon: Mack Hook, MD;  Location: [facility];  Service: Orthopedics;  Laterality: Left;  . TRIGGER FINGER RELEASE Right 06/22/2015   Procedure: RELEASE TRIGGER FINGER/A-1 PULLEY;  Surgeon: Marcene Corning, MD;  Location: [facility] OR;  Service: Orthopedics;  Laterality: Right;  Bier Block Anesthesia       Home Medications    Prior to Admission medications   Medication Sig Start Date End Date Taking? Authorizing Provider  HYDROcodone-acetaminophen (NORCO) 5-325 MG tablet Take 1-2 tablets by mouth every 4 (four) hours as needed for moderate pain. Patient not taking: Reported on  06/20/2017 06/22/15   Elodia Florence, PA-C  meloxicam (MOBIC) 15 MG tablet Take 1 tablet (15 mg total) by mouth daily. For 3 days then daily as needed 01/26/20   Lurene Shadow, PA-C    Family History No family history on file.  Social History Social History   Tobacco Use  . Smoking status: Never Smoker  . Smokeless tobacco: Never Used  Substance Use Topics  . Alcohol use: Yes    Comment: special occassion   . Drug use: No     Allergies   Patient has no known allergies.   Review of Systems Review of Systems  Musculoskeletal: Positive for arthralgias, joint swelling and myalgias.  Skin: Negative for color change and wound.  Neurological: Positive for numbness (intermittent tingling to Right 3rd and 4th fingers).     Physical Exam Triage Vital Signs ED Triage Vitals  Enc Vitals Group     BP 01/26/20 1734 (!) 131/95     Pulse Rate 01/26/20 1734 72     Resp 01/26/20 1734 20     Temp 01/26/20 1734 98.1 F (36.7 C)     Temp Source 01/26/20 1734 Oral     SpO2 01/26/20 1734 98 %     Weight --      Height --      Head Circumference --  Peak Flow --      Pain Score 01/26/20 1726 3     Pain Loc --      Pain Edu? --      Excl. in GC? --    No data found.  Updated Vital Signs BP (!) 131/95 (BP Location: Left Arm)   Pulse 72   Temp 98.1 F (36.7 C) (Oral)   Resp 20   SpO2 98%   Visual Acuity Right Eye Distance:   Left Eye Distance:   Bilateral Distance:    Right Eye Near:   Left Eye Near:    Bilateral Near:     Physical Exam Vitals and nursing note reviewed.  Constitutional:      Appearance: Normal appearance. He is well-developed.  HENT:     Head: Normocephalic and atraumatic.  Cardiovascular:     Rate and Rhythm: Normal rate and regular rhythm.     Pulses:          Radial pulses are 2+ on the right side.  Pulmonary:     Effort: Pulmonary effort is normal.  Musculoskeletal:        General: Swelling and tenderness present. Normal range of motion.       Cervical back: Normal range of motion.     Comments: Right hand: moderate edema, tenderness to palm aspect over 3rd and 4th metacarpals. Full ROM No tenderness to wrist.   Skin:    General: Skin is warm and dry.     Capillary Refill: Capillary refill takes less than 2 seconds.     Findings: No bruising, erythema or rash.  Neurological:     Mental Status: He is alert and oriented to person, place, and time.     Sensory: No sensory deficit.  Psychiatric:        Behavior: Behavior normal.      UC Treatments / Results  Labs (all labs ordered are listed, but only abnormal results are displayed) Labs Reviewed - No data to display  EKG   Radiology Narrative & Impression  CLINICAL DATA:  Right hand pain  EXAM: RIGHT HAND - COMPLETE 3+ VIEW  COMPARISON:  None.  FINDINGS: There is no evidence of fracture or dislocation. There is no evidence of arthropathy or other focal bone abnormality. Soft tissues are unremarkable.  IMPRESSION: Negative.   Electronically Signed   By: Marlan Palau M.D.   On: 01/26/2020 18:03     Procedures Procedures (including critical care time)  Medications Ordered in UC Medications - No data to display  Initial Impression / Assessment and Plan / UC Course  I have reviewed the triage vital signs and the nursing notes.  Pertinent labs & imaging results that were available during my care of the patient were reviewed by me and considered in my medical decision making (see chart for details).    Discussed imaging with pt  Wrist splint applied for comfort Encouraged f/u with Sports Medicine  AVS given  Final Clinical Impressions(s) / UC Diagnoses   Final diagnoses:  Right hand pain  Swelling of right hand  History of trigger finger     Discharge Instructions      Meloxicam (Mobic) is an antiinflammatory to help with pain and inflammation.  Do not take ibuprofen, Advil, Aleve, or any other medications that contain NSAIDs  while taking meloxicam as this may cause stomach upset or even ulcers if taken in large amounts for an extended period of time.   Call to schedule a follow  up appointment with Sports Medicine later this week or next for further evaluation and treatment of symptoms.    ED Prescriptions    Medication Sig Dispense Auth. Provider   meloxicam (MOBIC) 15 MG tablet Take 1 tablet (15 mg total) by mouth daily. For 3 days then daily as needed 10 tablet Lurene Shadow, [place]     PDMP not reviewed this encounter.   Lurene Shadow, [place] 01/29/20 [phone]

## 2020-01-26 NOTE — ED Triage Notes (Addendum)
Pt presents to Urgent Care with c/o R hand/finger pain and swelling x approx 10 days. 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 Swelling noted to distal hand. States pain radiates as far up as elbow at times when he moves fingers or hand. Pt states he has hx of trigger finger release to R hand in approx 2004. Pt is a Psychologist, occupational by profession.

## 2020-01-26 NOTE — Discharge Instructions (Signed)
  Meloxicam (Mobic) is an antiinflammatory to help with pain and inflammation.  Do not take ibuprofen, Advil, Aleve, or any other medications that contain NSAIDs while taking meloxicam as this may cause stomach upset or even ulcers if taken in large amounts for an extended period of time.   Call to schedule a follow up appointment with Sports Medicine later this week or next for further evaluation and treatment of symptoms.

## 2021-09-21 DIAGNOSIS — M25512 Pain in left shoulder: Secondary | ICD-10-CM | POA: Insufficient documentation

## 2021-09-21 DIAGNOSIS — M79645 Pain in left finger(s): Secondary | ICD-10-CM | POA: Insufficient documentation

## 2021-10-26 DIAGNOSIS — G5620 Lesion of ulnar nerve, unspecified upper limb: Secondary | ICD-10-CM | POA: Insufficient documentation

## 2021-10-26 DIAGNOSIS — G5601 Carpal tunnel syndrome, right upper limb: Secondary | ICD-10-CM | POA: Insufficient documentation

## 2021-11-11 DIAGNOSIS — G8918 Other acute postprocedural pain: Secondary | ICD-10-CM | POA: Insufficient documentation

## 2022-01-03 IMAGING — DX DG HAND COMPLETE 3+V*R*
3 series · 3 of 3 positions shown · non-contrast
Comparison: None.

CLINICAL DATA: Right hand pain

EXAM:
RIGHT HAND - COMPLETE 3+ VIEW

[hand pa]
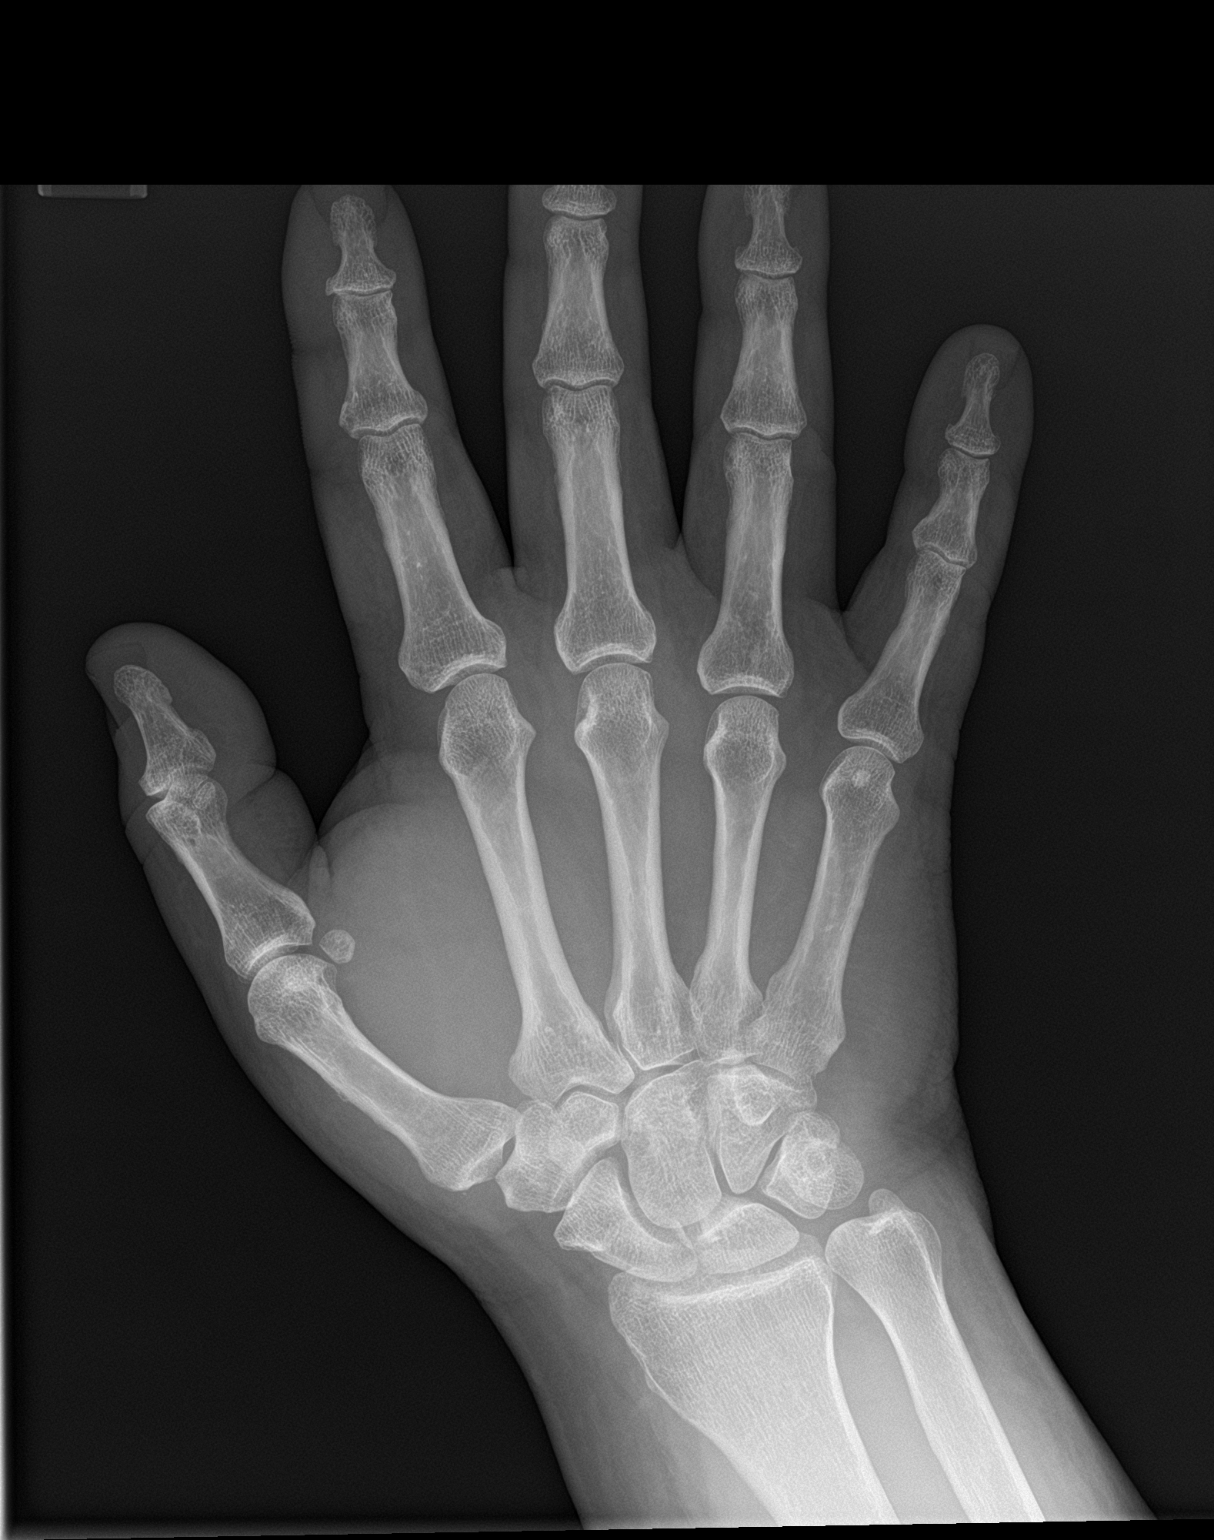

[hand obl]
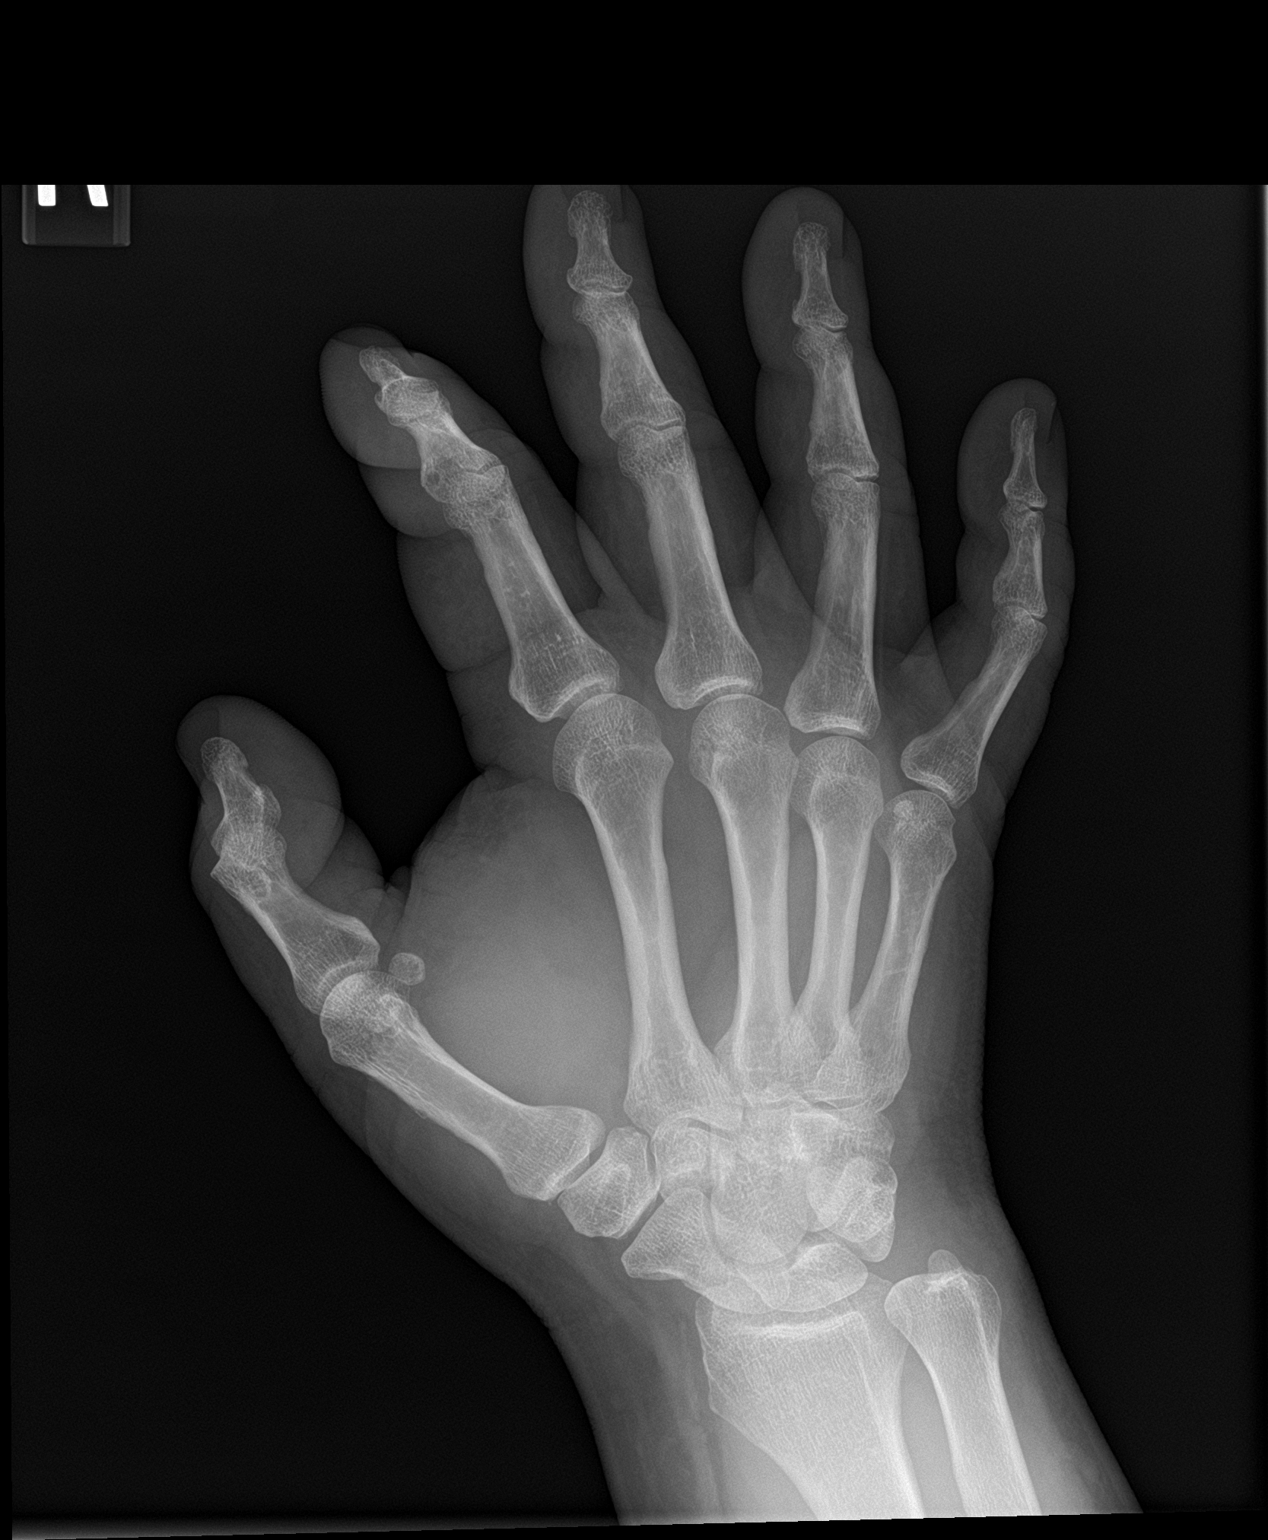

[hand lat]
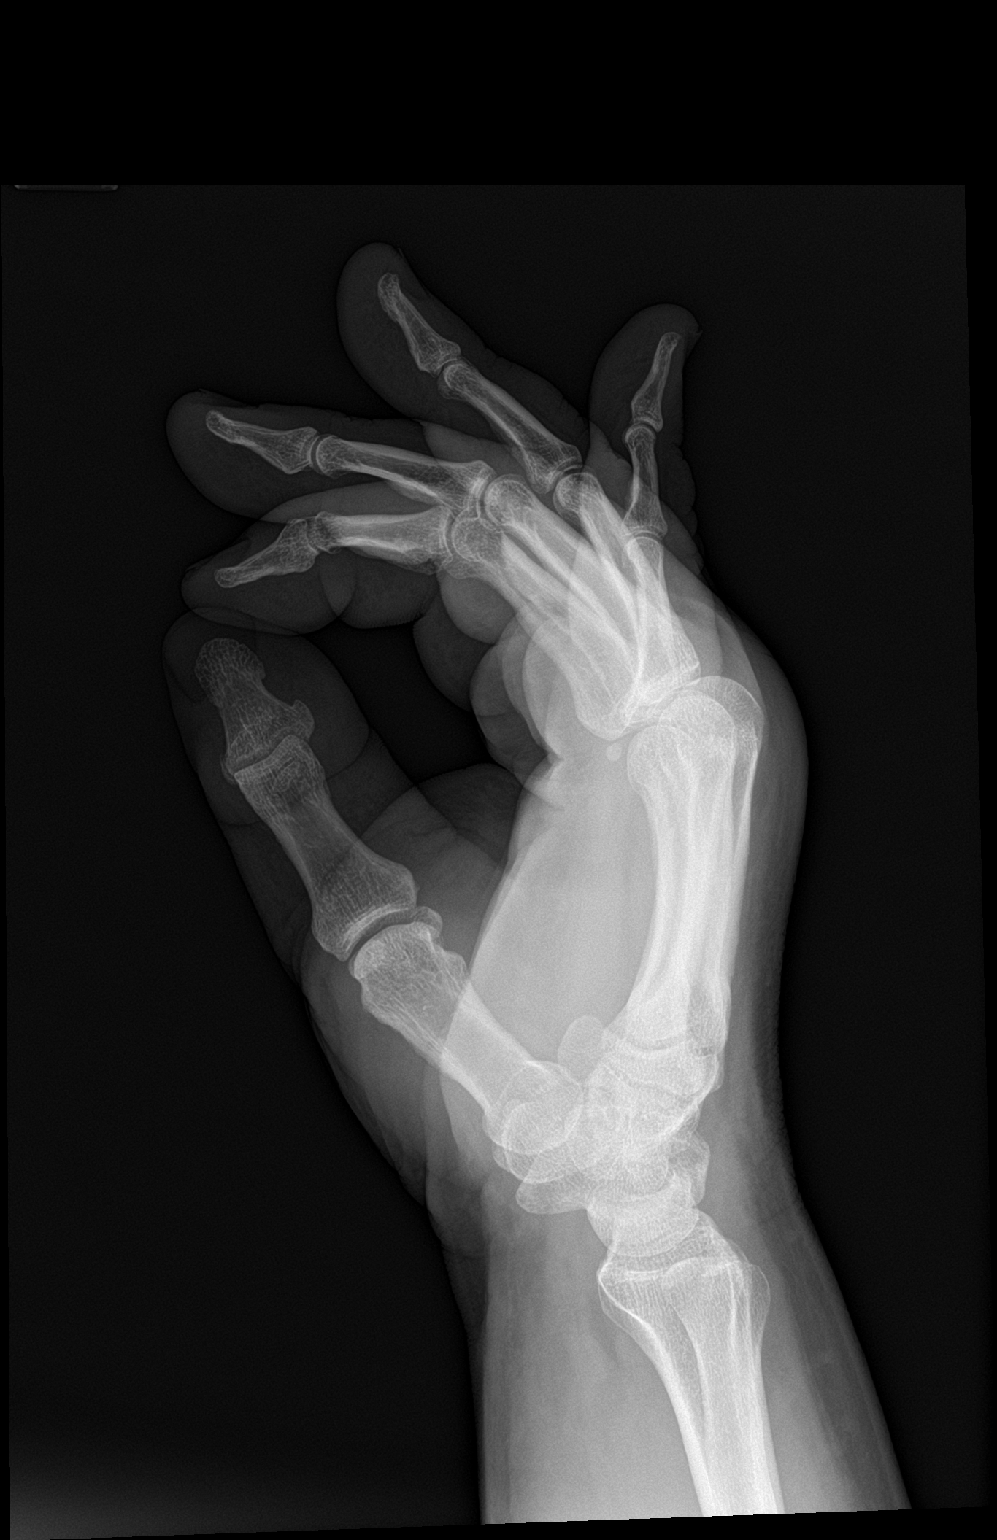

[3 of 3 positions shown; findings below may reference images not displayed]

FINDINGS: There is no evidence of fracture or dislocation. There is no
evidence of arthropathy or other focal bone abnormality. Soft
tissues are unremarkable.
IMPRESSION: Negative.

## 2022-01-19 ENCOUNTER — Encounter (HOSPITAL_BASED_OUTPATIENT_CLINIC_OR_DEPARTMENT_OTHER): Payer: Self-pay | Admitting: Orthopedic Surgery

## 2022-01-19 NOTE — Progress Notes (Signed)
Spoke w/ via phone for pre-op interview--- [place]----  NONE-surgeon orders pending             Lab results------ COVID test -----patient states asymptomatic no test needed Arrive at -------0530 NPO after MN NO Solid Food.  Med rec completed Medications to take morning of surgery ----- Diabetic medication ----- Patient instructed no nail polish to be worn day of surgery Patient instructed to bring photo id and insurance card day of surgery Patient aware to have Driver (ride ) / caregiver   Mother Victor Brown for 24 hours after surgery  Patient Special Instructions ----- Pre-Op special Istructions ----- Patient verbalized understanding of instructions that were given at this phone interview. Patient denies shortness of breath, chest pain, fever, cough at this phone interview.

## 2022-01-20 NOTE — H&P (Signed)
Preoperative History & Physical Exam  Surgeon: Matt Holmes, MD  Diagnosis: Right carpal tunnel syndrome  Planned Procedure: Procedure(s) (LRB): CARPAL TUNNEL RELEASE (Right)  History of Present Illness:   Patient is a 56 y.o. male with symptoms consistent with Right carpal tunnel syndrome who presents for surgical intervention. The risks, benefits and alternatives of surgical intervention were discussed and informed consent was obtained prior to surgery.  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    Sleep apnea    uses cpap    Past Surgical History:  Past Surgical History:  Procedure Laterality Date   BACK SURGERY  1999   CARPAL TUNNEL RELEASE Left 2014   ELBOW DEBRIDEMENT Right 2002   KNEE ARTHROSCOPY Bilateral    MANDIBLE SURGERY N/A 1986   ROTATOR CUFF REPAIR Right 2002   ROTATOR CUFF REPAIR Left 2008   TRIGGER FINGER RELEASE Bilateral 2001-2016   multiple   TRIGGER FINGER RELEASE Left 08/03/2014   Procedure: LEFT LONG FINGER TRIGGER RELEASE;  Surgeon: Milly Jakob, MD;  Location: [place];  Service: Orthopedics;  Laterality: Left;   TRIGGER FINGER RELEASE Right 06/22/2015   Procedure: RELEASE TRIGGER FINGER/A-1 PULLEY;  Surgeon: Melrose Nakayama, MD;  Location: [place];  Service: Orthopedics;  Laterality: Right;  Bier Block Anesthesia    Medications:  Prior to Admission medications   Medication Sig Start Date End Date Taking? Authorizing Provider  HYDROcodone-acetaminophen (NORCO) 5-325 MG tablet Take 1-2 tablets by mouth every 4 (four) hours as needed for moderate pain. Patient not taking: Reported on 06/20/2017 06/22/15   Loni Dolly, PA-C  meloxicam (MOBIC) 15 MG tablet Take 1 tablet (15 mg total) by mouth daily. For 3 days then daily as needed 01/26/20   Noe Gens, PA-C    Allergies:  Patient has no known allergies.  Review of Systems: Negative except per  HPI.  Physical Exam: Alert and oriented, NAD Head and neck: no masses, normal alignment CV: pulse intact Pulm: no increased work of breathing, respirations even and unlabored Abdomen: non-distended Extremities: extremities warm and well perfused  LABS: No results found for this or any previous visit (from the past 2160 hour(s)).   Complete History and Physical exam available in the office notes  Orene Desanctis

## 2022-01-31 ENCOUNTER — Other Ambulatory Visit: Payer: Self-pay

## 2022-01-31 ENCOUNTER — Ambulatory Visit (HOSPITAL_BASED_OUTPATIENT_CLINIC_OR_DEPARTMENT_OTHER)
Admission: RE | Admit: 2022-01-31 | Discharge: 2022-01-31 | Disposition: A | Discharge status: Home / Self Care | Payer: 59 | Attending: Orthopedic Surgery | Admitting: Orthopedic Surgery

## 2022-01-31 ENCOUNTER — Encounter (HOSPITAL_BASED_OUTPATIENT_CLINIC_OR_DEPARTMENT_OTHER)
Admission: RE | Disposition: A | Discharge status: Home / Self Care | Payer: Self-pay | Source: Home / Self Care | Attending: Orthopedic Surgery

## 2022-01-31 ENCOUNTER — Encounter (HOSPITAL_BASED_OUTPATIENT_CLINIC_OR_DEPARTMENT_OTHER): Payer: Self-pay

## 2022-01-31 ENCOUNTER — Ambulatory Visit (HOSPITAL_BASED_OUTPATIENT_CLINIC_OR_DEPARTMENT_OTHER): Payer: 59 | Admitting: Certified Registered Nurse Anesthetist

## 2022-01-31 ENCOUNTER — Encounter (HOSPITAL_BASED_OUTPATIENT_CLINIC_OR_DEPARTMENT_OTHER): Payer: Self-pay | Admitting: Orthopedic Surgery

## 2022-01-31 DIAGNOSIS — G5601 Carpal tunnel syndrome, right upper limb: Secondary | ICD-10-CM | POA: Diagnosis present

## 2022-01-31 DIAGNOSIS — G4733 Obstructive sleep apnea (adult) (pediatric): Secondary | ICD-10-CM

## 2022-01-31 DIAGNOSIS — Z6841 Body Mass Index (BMI) 40.0 and over, adult: Secondary | ICD-10-CM | POA: Diagnosis not present

## 2022-01-31 DIAGNOSIS — Z9989 Dependence on other enabling machines and devices: Secondary | ICD-10-CM

## 2022-01-31 DIAGNOSIS — G4730 Sleep apnea, unspecified: Secondary | ICD-10-CM | POA: Diagnosis not present

## 2022-01-31 HISTORY — PX: CARPAL TUNNEL RELEASE: SHX101

## 2022-01-31 SURGERY — CARPAL TUNNEL RELEASE
Anesthesia: Monitor Anesthesia Care | Site: Hand | Laterality: Right

## 2022-01-31 MED ORDER — FENTANYL CITRATE (PF) 100 MCG/2ML IJ SOLN
INTRAMUSCULAR | Status: AC
  Filled 2022-01-31: qty 2

## 2022-01-31 MED ORDER — LIDOCAINE HCL (CARDIAC) PF 100 MG/5ML IV SOSY
PREFILLED_SYRINGE | INTRAVENOUS | Status: DC | PRN
  Administered 2022-01-31: 60 mg via INTRAVENOUS

## 2022-01-31 MED ORDER — PROPOFOL 10 MG/ML IV BOLUS
INTRAVENOUS | Status: AC
  Filled 2022-01-31: qty 20

## 2022-01-31 MED ORDER — LIDOCAINE HCL (PF) 1 % IJ SOLN
INTRAMUSCULAR | Status: DC | PRN
  Administered 2022-01-31: 5 mL

## 2022-01-31 MED ORDER — PROPOFOL 10 MG/ML IV BOLUS
INTRAVENOUS | Status: DC | PRN
  Administered 2022-01-31: 20 mg via INTRAVENOUS
  Administered 2022-01-31: 40 mg via INTRAVENOUS
  Administered 2022-01-31 (×2): 30 mg via INTRAVENOUS

## 2022-01-31 MED ORDER — MIDAZOLAM HCL 2 MG/2ML IJ SOLN
INTRAMUSCULAR | Status: AC
  Filled 2022-01-31: qty 2

## 2022-01-31 MED ORDER — 0.9 % SODIUM CHLORIDE (POUR BTL) OPTIME
TOPICAL | Status: DC | PRN
  Administered 2022-01-31: 1000 mL

## 2022-01-31 MED ORDER — KETOROLAC TROMETHAMINE 30 MG/ML IJ SOLN: 30.0000 mg | Freq: Once | INTRAMUSCULAR | Status: DC | PRN

## 2022-01-31 MED ORDER — FENTANYL CITRATE (PF) 100 MCG/2ML IJ SOLN: 25.0000 ug | INTRAMUSCULAR | Status: DC | PRN

## 2022-01-31 MED ORDER — OXYCODONE HCL 5 MG/5ML PO SOLN: 5.0000 mg | Freq: Once | ORAL | Status: DC | PRN

## 2022-01-31 MED ORDER — MIDAZOLAM HCL 5 MG/5ML IJ SOLN
INTRAMUSCULAR | Status: DC | PRN
  Administered 2022-01-31 (×2): 1 mg via INTRAVENOUS

## 2022-01-31 MED ORDER — LIDOCAINE HCL 1 % IJ SOLN
INTRAMUSCULAR | Status: AC
  Filled 2022-01-31: qty 20

## 2022-01-31 MED ORDER — BACITRACIN ZINC 500 UNIT/GM EX OINT
TOPICAL_OINTMENT | CUTANEOUS | Status: DC | PRN
  Administered 2022-01-31: 1 via TOPICAL

## 2022-01-31 MED ORDER — LACTATED RINGERS IV SOLN: INTRAVENOUS | Status: DC

## 2022-01-31 MED ORDER — BUPIVACAINE HCL (PF) 0.5 % IJ SOLN
INTRAMUSCULAR | Status: AC
  Filled 2022-01-31: qty 30

## 2022-01-31 MED ORDER — BACITRACIN ZINC 500 UNIT/GM EX OINT
TOPICAL_OINTMENT | CUTANEOUS | Status: AC
  Filled 2022-01-31: qty 28.35

## 2022-01-31 MED ORDER — FENTANYL CITRATE (PF) 100 MCG/2ML IJ SOLN
INTRAMUSCULAR | Status: DC | PRN
  Administered 2022-01-31 (×2): 50 ug via INTRAVENOUS

## 2022-01-31 MED ORDER — ONDANSETRON HCL 4 MG/2ML IJ SOLN: 4.0000 mg | Freq: Once | INTRAMUSCULAR | Status: DC | PRN

## 2022-01-31 MED ORDER — HYDROCODONE-ACETAMINOPHEN 5-325 MG PO TABS: 1.0000 | ORAL_TABLET | Freq: Four times a day (QID) | ORAL | 0 refills | Status: AC | PRN

## 2022-01-31 MED ORDER — OXYCODONE HCL 5 MG PO TABS: 5.0000 mg | ORAL_TABLET | Freq: Once | ORAL | Status: DC | PRN

## 2022-01-31 MED ORDER — BUPIVACAINE HCL (PF) 0.5 % IJ SOLN
INTRAMUSCULAR | Status: DC | PRN
  Administered 2022-01-31: 5 mL

## 2022-01-31 SURGICAL SUPPLY — 27 items
BLADE SURG 15 STRL LF DISP TIS (BLADE) ×1 IMPLANT
BLADE SURG 15 STRL SS (BLADE) ×1
BNDG CMPR 9X4 STRL LF SNTH (GAUZE/BANDAGES/DRESSINGS) ×1
BNDG ELASTIC 4X5.8 VLCR STR LF (GAUZE/BANDAGES/DRESSINGS) ×1 IMPLANT
BNDG ESMARK 4X9 LF (GAUZE/BANDAGES/DRESSINGS) ×1 IMPLANT
COVER BACK TABLE 60X90IN (DRAPES) ×1 IMPLANT
CUFF TOURN SGL QUICK 18X4 (TOURNIQUET CUFF) ×1 IMPLANT
DRAPE EXTREMITY T 121X128X90 (DISPOSABLE) ×1 IMPLANT
DRSG EMULSION OIL 3X3 NADH (GAUZE/BANDAGES/DRESSINGS) ×1 IMPLANT
GAUZE 4X4 16PLY ~~LOC~~+RFID DBL (SPONGE) ×1 IMPLANT
GAUZE SPONGE 4X4 12PLY STRL (GAUZE/BANDAGES/DRESSINGS) ×1 IMPLANT
GLOVE BIOGEL PI IND STRL 7.5 (GLOVE) ×1 IMPLANT
GOWN STRL REUS W/TWL LRG LVL3 (GOWN DISPOSABLE) ×1 IMPLANT
HIBICLENS CHG 4% 4OZ BTL (MISCELLANEOUS) ×1 IMPLANT
KIT TURNOVER CYSTO (KITS) ×1 IMPLANT
KNIFE CARPAL TUNNEL (BLADE) ×1 IMPLANT
NEEDLE HYPO 22GX1.5 SAFETY (NEEDLE) ×1 IMPLANT
NS IRRIG 500ML POUR BTL (IV SOLUTION) ×1 IMPLANT
PACK BASIN DAY SURGERY FS (CUSTOM PROCEDURE TRAY) ×1 IMPLANT
PAD CAST 4YDX4 CTTN HI CHSV (CAST SUPPLIES) ×1 IMPLANT
PADDING CAST COTTON 4X4 STRL (CAST SUPPLIES) ×1
SUT ETHILON 4 0 PS 2 18 (SUTURE) ×1 IMPLANT
SYR 10ML LL (SYRINGE) ×1 IMPLANT
SYR BULB EAR ULCER 3OZ GRN STR (SYRINGE) ×1 IMPLANT
TOWEL OR 17X26 10 PK STRL BLUE (TOWEL DISPOSABLE) ×1 IMPLANT
TRAY DSU PREP LF (CUSTOM PROCEDURE TRAY) ×1 IMPLANT
UNDERPAD 30X36 HEAVY ABSORB (UNDERPADS AND DIAPERS) ×1 IMPLANT

## 2022-01-31 NOTE — Transfer of Care (Signed)
Immediate Anesthesia Transfer of Care Note  Patient: Victor Brown  Procedure(s) Performed: CARPAL TUNNEL RELEASE (Right: Hand)  Patient Location: PACU  Anesthesia Type:MAC  Level of Consciousness: awake, alert  and oriented  Airway & Oxygen Therapy: Patient Spontanous Breathing  Post-op Assessment: Report given to RN and Post -op Vital signs reviewed and stable  Post vital signs: Reviewed and stable  Last Vitals:  Vitals Value Taken Time  BP    Temp    Pulse    Resp    SpO2      Last Pain:  Vitals:   01/31/22 0604  TempSrc: Oral  PainSc: 0-No pain      Patients Stated Pain Goal: 6 (09/62/83 6629)  Complications: No notable events documented.

## 2022-01-31 NOTE — Op Note (Signed)
OPERATIVE NOTE  DATE OF PROCEDURE: 01/31/2022  SURGEON: Izell Soddy-Daisy, MD  PREOPERATIVE DIAGNOSIS: Right Carpal Tunnel Syndrome  POSTOPERATIVE DIAGNOSIS: Same  NAME OF PROCEDURE: Right Carpal Tunnel Release  ANESTHESIA: Local + MAC  SKIN PREPARATION: Hibiclens  ESTIMATED BLOOD LOSS: Minimal  IMPLANTS: none  INDICATIONS:  Victor Brown is a 56 y.o. male who presents with right carpal tunnel syndrome, refractory to nonoperative treatment. The patient has decided to proceed with surgical intervention.  Risks, benefits and alternatives of operative management were discussed including, but not limited to, risks of anesthesia complications, infection, pain, persistent symptoms, stiffness, need for future surgery.  The patient understands, agrees and elects to proceed with surgery.    DESCRIPTION OF PROCEDURE: The patient was placed in the usual supine position and the right upper extremity was prepped and draped in normal sterile fashion.  After local block anesthetic to the right hand and wrist, a standard 2 cm incision was made in the midpalm.  This was carried down through the subcutaneous tissues and palmar fascia to the transverse carpal ligament.  The distal one-half of the transverse carpal ligament was incised longitudinally under direct vision using a 15 blade.  The carpal tunnel release guide was then placed under direct vision on the transverse carpal ligament and slid proximally.  The guide was palpated into appropriate alignment longitudinally.  Contact with the transverse carpal ligament was maintained throughout passing.  The blade was engaged into the guide and the remaining portion of the transverse carpal ligament released completely.  No other abnormalities were noted.  The wound was copiously irrigated and the skin closed using horizontal mattress 4-0 nylon sutures.  A light bulky dressing was placed.  The patient tolerated the procedure well and returned to the recovery room in stable  condition.  I was present for the entire surgical procedure.   Matt Holmes, MD

## 2022-01-31 NOTE — Anesthesia Procedure Notes (Signed)
Procedure Name: MAC Date/Time: 01/31/2022 7:40 AM  Performed by: Lieutenant Diego, CRNAPre-anesthesia Checklist: Patient identified, Emergency Drugs available, Suction available, Patient being monitored and Timeout performed Patient Re-evaluated:Patient Re-evaluated prior to induction Oxygen Delivery Method: Simple face mask Preoxygenation: Pre-oxygenation with 100% oxygen Induction Type: IV induction

## 2022-01-31 NOTE — Anesthesia Preprocedure Evaluation (Signed)
Anesthesia Evaluation  Patient identified by MRN, date of birth, ID band Patient awake    Reviewed: Allergy & Precautions, NPO status , Patient's Chart, lab work & pertinent test results  Airway Mallampati: III  TM Distance: <3 FB Neck ROM: Full    Dental no notable dental hx.    Pulmonary sleep apnea and Continuous Positive Airway Pressure Ventilation ,    Pulmonary exam normal breath sounds clear to auscultation       Cardiovascular negative cardio ROS Normal cardiovascular exam Rhythm:Regular Rate:Normal     Neuro/Psych negative neurological ROS  negative psych ROS   GI/Hepatic negative GI ROS, Neg liver ROS,   Endo/Other  Morbid obesity  Renal/GU negative Renal ROS  negative genitourinary   Musculoskeletal negative musculoskeletal ROS (+)   Abdominal   Peds negative pediatric ROS (+)  Hematology negative hematology ROS (+)   Anesthesia Other Findings   Reproductive/Obstetrics negative OB ROS                             Anesthesia Physical Anesthesia Plan  ASA: 3  Anesthesia Plan: MAC   Post-op Pain Management: Minimal or no pain anticipated   Induction: Intravenous  PONV Risk Score and Plan: 1 and Propofol infusion and Treatment may vary due to age or medical condition  Airway Management Planned: Simple Face Mask  Additional Equipment:   Intra-op Plan:   Post-operative Plan:   Informed Consent: I have reviewed the patients History and Physical, chart, labs and discussed the procedure including the risks, benefits and alternatives for the proposed anesthesia with the patient or authorized representative who has indicated his/her understanding and acceptance.     Dental advisory given  Plan Discussed with: CRNA and Surgeon  Anesthesia Plan Comments:         Anesthesia Quick Evaluation

## 2022-01-31 NOTE — Discharge Instructions (Addendum)
Orthopaedic Hand Surgery Discharge Instructions  WEIGHT BEARING STATUS: Non weight bearing on operative extremity  INCISION CARE: Keep dressing over your incision clean and dry until 5 days after surgery. You may shower by placing a waterproof covering over your dressing. Once dressing is removed, you may allow water to run over the incision and then place Band-Aids over incision. Do not scrub your incision or apply creams/lotions. Do not submerge your incision or swim for 3 weeks after surgery. Contact your surgeon or primary care doctor if you develop redness or drainage from your incision.   PAIN CONTROL: First line medications for post operative pain control are Tylenol (acetaminophen) and Motrin (ibuprofen) if you are able to take these medications. If you have been prescribed a medication these can be taken as breakthrough pain medications. Please note that some narcotic pain medication has acetaminophen added and you should never consume more than 4,000mg of acetaminophen in 24-hour period. Please note that if you are given Toradol (ketorolac) you should not take similar medications such as ibuprofen or naproxen.  DISCHARGE MEDICATIONS: If you have been prescribed medication it was sent electronically to your pharmacy. No changes have been made to your home medications.  ICE/ELEVATION: Ice and elevate your injured extremity as needed. Avoid direct contact of ice with skin.   BANDAGE FEELS TOO TIGHT: If your bandage feels too tight, first make sure you are elevating your fingers as much as possible. The outer layer of the bandage can be unwrapped and reapplied more loosely. If no improvement, you may carefully cut the inner layer longitudinally until the pressure has resolved and then rewrap the outer layer. If you are not comfortable with these instructions, please call the office and the bandage can be changed for you.   FOLLOW UP: You will be called after surgery with an appointment date and  time, however if you have not received a phone call within 3 days, please call during regular office hours at [phone] to schedule a post operative appointment.  Please Seek Medical Attention if: Call MD for: pain or pressure in chest, jaw, arm, back, neck  Call MD for: temperature greater than 101 F for more than 24 hrs Call MD for: difficulty breathing Call MD for: incision redness, bleeding, drainage  Call MD for: palpitations or feeling that the heart is racing  Call MD for: increased swelling in arm, leg, ankle, or abdomen  Call MD for: lightheadedness, dizziness, fainting Call 911 or go to ER for any medical emergency if you are not able to get in touch with your doctor   J. Reid Spears, MD Orthopaedic Hand Surgeon [facility] Office number: [phone] [address] , [place] [address]   Post Anesthesia Home Care Instructions  Activity: Get plenty of rest for the remainder of the day. A responsible individual must stay with you for 24 hours following the procedure.  For the next 24 hours, DO NOT: -Drive a car -Operate machinery -Drink alcoholic beverages -Take any medication unless instructed by your physician -Make any legal decisions or sign important papers.  Meals: Start with liquid foods such as gelatin or soup. Progress to regular foods as tolerated. Avoid greasy, spicy, heavy foods. If nausea and/or vomiting occur, drink only clear liquids until the nausea and/or vomiting subsides. Call your physician if vomiting continues.  Special Instructions/Symptoms: Your throat may feel dry or sore from the anesthesia or the breathing tube placed in your throat during surgery. If this causes discomfort, gargle with warm   salt water. The discomfort should disappear within 24 hours.      

## 2022-01-31 NOTE — Anesthesia Postprocedure Evaluation (Signed)
Anesthesia Post Note  Patient: Victor Brown  Procedure(s) Performed: CARPAL TUNNEL RELEASE (Right: Hand)     Patient location during evaluation: PACU Anesthesia Type: MAC Level of consciousness: awake and alert Pain management: pain level controlled Vital Signs Assessment: post-procedure vital signs reviewed and stable Respiratory status: spontaneous breathing, nonlabored ventilation, respiratory function stable and patient connected to nasal cannula oxygen Cardiovascular status: stable and blood pressure returned to baseline Postop Assessment: no apparent nausea or vomiting Anesthetic complications: no   No notable events documented.  Last Vitals:  Vitals:   01/31/22 0810 01/31/22 0815  BP: (!) 162/96 (!) 159/105  Pulse: 69 70  Resp: 16 16  Temp: 36.6 C   SpO2: 96% 97%    Last Pain:  Vitals:   01/31/22 0810  TempSrc:   PainSc: 0-No pain                 Brylea Pita S

## 2022-01-31 NOTE — Progress Notes (Signed)
Pt was called and notified that he forgot d/c instructions.  Pt doesn't want to come back.

## 2022-01-31 NOTE — Interval H&P Note (Signed)
History and Physical Interval Note:  01/31/2022 7:33 AM  Victor Brown  has presented today for surgery, with the diagnosis of Right carpal tunnel syndrome.  The various methods of treatment have been discussed with the patient and family. After consideration of risks, benefits and other options for treatment, the patient has consented to  Procedure(s) with comments: CARPAL TUNNEL RELEASE (Right) - 28min as a surgical intervention.  The patient's history has been reviewed, patient examined, no change in status, stable for surgery.  I have reviewed the patient's chart and labs.  Questions were answered to the patient's satisfaction.     Orene Desanctis

## 2022-02-01 ENCOUNTER — Encounter (HOSPITAL_BASED_OUTPATIENT_CLINIC_OR_DEPARTMENT_OTHER): Payer: Self-pay | Admitting: Orthopedic Surgery

## 2022-02-13 DIAGNOSIS — M25519 Pain in unspecified shoulder: Secondary | ICD-10-CM | POA: Insufficient documentation

## 2022-02-23 DIAGNOSIS — M542 Cervicalgia: Secondary | ICD-10-CM | POA: Insufficient documentation

## 2022-02-23 DIAGNOSIS — M5412 Radiculopathy, cervical region: Secondary | ICD-10-CM | POA: Insufficient documentation

## 2022-04-17 DIAGNOSIS — S0540XA Penetrating wound of orbit with or without foreign body, unspecified eye, initial encounter: Secondary | ICD-10-CM | POA: Insufficient documentation

## 2022-07-31 DIAGNOSIS — M25612 Stiffness of left shoulder, not elsewhere classified: Secondary | ICD-10-CM | POA: Insufficient documentation

## 2022-11-16 DIAGNOSIS — M7541 Impingement syndrome of right shoulder: Secondary | ICD-10-CM | POA: Insufficient documentation

## 2022-11-16 DIAGNOSIS — M19019 Primary osteoarthritis, unspecified shoulder: Secondary | ICD-10-CM | POA: Insufficient documentation

## 2023-01-04 DIAGNOSIS — M25611 Stiffness of right shoulder, not elsewhere classified: Secondary | ICD-10-CM | POA: Insufficient documentation

## 2023-01-04 DIAGNOSIS — M25511 Pain in right shoulder: Secondary | ICD-10-CM | POA: Insufficient documentation

## 2023-05-29 ENCOUNTER — Encounter: Payer: Self-pay | Admitting: Neurology

## 2023-05-29 ENCOUNTER — Ambulatory Visit (INDEPENDENT_AMBULATORY_CARE_PROVIDER_SITE_OTHER): Payer: No Typology Code available for payment source | Admitting: Neurology

## 2023-05-29 VITALS — BP 176/101 | HR 92 | Ht 72.0 in | Wt 334.5 lb

## 2023-05-29 DIAGNOSIS — G8929 Other chronic pain: Secondary | ICD-10-CM | POA: Diagnosis not present

## 2023-05-29 DIAGNOSIS — R519 Headache, unspecified: Secondary | ICD-10-CM

## 2023-05-29 DIAGNOSIS — G4733 Obstructive sleep apnea (adult) (pediatric): Secondary | ICD-10-CM

## 2023-05-29 NOTE — Progress Notes (Signed)
Chief Complaint  Patient presents with   Room 15    Pt is here Alone. Pt states that he has had a few concussions. Pt states that he has a continuous headache. Pt states that he has some blurry vision as well as double vision. Pt denies any nausea with his headache. Pt denies any light or sound sensitivity. Pt states that he has trouble with sleep.       ASSESSMENT AND PLAN  Victor Brown is a 58 y.o. male   Chronic daily headache Obesity Obstructive sleep apnea  Laboratory evaluations to rule out inflammatory process, TSH  MRI of the brain to rule out structural abnormality  He does not want to take medication for his headache now  DIAGNOSTIC DATA (LABS, IMAGING, TESTING) - I reviewed patient records, labs, notes, testing and imaging myself where available.   MEDICAL HISTORY:  Victor Brown is a 58 year old male, seen in request by orthopedic surgeon Dr. Beverely Low, for evaluation of chronic daily headaches, he does not have primary care, initial evaluation was on May 29, 2023    History is obtained from the patient and review of electronic medical records. I personally reviewed pertinent available imaging films in PACS.   PMHx of  Obese OSA-CPAP,  CTS bilateral Left ulnar transpositional surgery in 2023, Facial reconstruction surgery Lumbar decompression surgery   He has obesity, obstructive sleep apnea, under the care of of [facility] neurologist Dr. 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  PHYSICAL EXAMNIATION:  Gen: NAD, conversant, well nourised, well groomed                     Cardiovascular: Regular rate rhythm, no peripheral edema, warm, nontender. Eyes: Conjunctivae clear without exudates or hemorrhage Neck: Supple, no carotid bruits. Pulmonary: Clear to auscultation bilaterally   NEUROLOGICAL EXAM:  MENTAL STATUS: Speech/cognition: Central obesity, awake, alert, oriented to history taking and casual conversation CRANIAL NERVES: CN II: Visual fields are full to confrontation. Pupils are round equal and briskly reactive to light. CN III, IV, VI: extraocular movement are normal. No ptosis. CN V: Facial sensation is intact to light touch CN VII: Face is symmetric with normal eye closure  CN VIII: Hearing is normal to causal conversation. CN IX, X: Phonation is normal. CN XI: Head turning and shoulder shrug are intact CN XII: Narrow oropharyngeal space  MOTOR: There is no pronator drift of out-stretched arms. Muscle bulk and tone are normal. Muscle strength is normal.  REFLEXES: Reflexes are 2+ and symmetric at the biceps, triceps, knees, and ankles. Plantar responses are flexor.  SENSORY: Intact to light touch, pinprick and vibratory sensation are intact in fingers and toes.  COORDINATION: There is no trunk or limb dysmetria noted.  GAIT/STANCE: Push-up to get up from seated position limited by his big body  habitus  REVIEW OF SYSTEMS:  Full 14 system review of systems performed and notable only for as above All other review of systems were negative.   ALLERGIES: No Known Allergies  HOME MEDICATIONS: Current Outpatient Medications  Medication Sig Dispense Refill   methocarbamol (ROBAXIN) 500 MG tablet Take 500 mg by mouth 4 (four) times daily.     No current facility-administered medications for this visit.    PAST MEDICAL  HISTORY: Past Medical History:  Diagnosis Date   Neuromuscular disorder (HCC)    trigger thumb- R    Sleep apnea    uses cpap    PAST SURGICAL HISTORY: Past Surgical History:  Procedure Laterality Date   BACK SURGERY  04/10/1997   L3-L4   CARPAL TUNNEL RELEASE Left 04/10/2012   CARPAL TUNNEL RELEASE Right 01/31/2022   Procedure: CARPAL TUNNEL RELEASE;  Surgeon: Gomez Cleverly, MD;  Location: [facility];  Service: Orthopedics;  Laterality: Right;    ELBOW DEBRIDEMENT Right 04/10/2000   KNEE ARTHROSCOPY Bilateral    MANDIBLE SURGERY N/A 04/10/1984   ROTATOR CUFF REPAIR Right 04/10/2000   ROTATOR CUFF REPAIR Left 04/10/2006   TRIGGER FINGER RELEASE Bilateral 2001-2016   multiple   TRIGGER FINGER RELEASE Left 08/03/2014   Procedure: LEFT LONG FINGER TRIGGER RELEASE;  Surgeon: Mack Hook, MD;  Location: [facility];  Service: Orthopedics;  Laterality: Left;   TRIGGER FINGER RELEASE Right 06/22/2015   Procedure: RELEASE TRIGGER FINGER/A-1 PULLEY;  Surgeon: Marcene Corning, MD;  Location: [facility];  Service: Orthopedics;  Laterality: Right;  Bier Block Anesthesia    FAMILY HISTORY: History reviewed. No pertinent family history.  SOCIAL HISTORY: Social History   Socioeconomic History   Marital status: Single    Spouse name: Not on file   Number of children: Not on file   Years of education: Not on file   Highest education level: Not on file  Occupational History   Not on file  Tobacco Use   Smoking status: Never   Smokeless tobacco: Never  Substance and Sexual Activity   Alcohol use: Yes    Comment: occ   Drug use: No   Sexual activity: Not Currently  Other Topics Concern   Not on file  Social History Narrative   Not on file   Social Drivers of Health   Financial Resource Strain: Not on file  Food Insecurity: No Food Insecurity (03/16/2022)   Received [facility]   Hunger Vital Sign    Worried About Running Out of Food in  the Last Year: Never true    Ran Out of Food in the Last Year: Never true  Transportation Needs: Not on file  Physical Activity: Not on file  Stress: Not on file  Social Connections: Unknown (08/19/2021)   Received from [facility], [facility]   Social Network    Social Network: Not on file  Intimate Partner Violence: Unknown (07/12/2021)   Received from [facility], [facility]   HITS    Physically Hurt: Not on file    Insult or Talk Down To: Not on file    Threaten Physical Harm: Not on file    Scream or Curse: Not on file      Levert Feinstein, M.D. Ph.D.  [facility] [address] [place], [place] [address] Ph: [phone] Fax: [phone]  CC:  Beverely Low, MD [address] [place],  [place] [address]  Patient, No Pcp Per

## 2023-05-30 LAB — COMPREHENSIVE METABOLIC PANEL
ALT: 34 [IU]/L (ref 0–44)
AST: 29 [IU]/L (ref 0–40)
Albumin: 4.3 g/dL (ref 3.8–4.9)
Alkaline Phosphatase: 89 [IU]/L (ref 44–121)
BUN/Creatinine Ratio: 12 (ref 9–20)
BUN: 10 mg/dL (ref 6–24)
Bilirubin Total: <0.2 mg/dL (ref 0.0–1.2)
CO2: 22 mmol/L (ref 20–29)
Calcium: 9.9 mg/dL (ref 8.7–10.2)
Chloride: 103 mmol/L (ref 96–106)
Creatinine, Ser: 0.84 mg/dL (ref 0.76–1.27)
Globulin, Total: 2.7 g/dL (ref 1.5–4.5)
Glucose: 98 mg/dL (ref 70–99)
Potassium: 4.4 mmol/L (ref 3.5–5.2)
Sodium: 141 mmol/L (ref 134–144)
Total Protein: 7 g/dL (ref 6.0–8.5)
eGFR: 103 mL/min/{1.73_m2} (ref >59)

## 2023-05-30 LAB — VITAMIN B12: Vitamin B-12: 865 pg/mL (ref 232–1245)

## 2023-05-30 LAB — CBC WITH DIFFERENTIAL/PLATELET
Basophils Absolute: 0 10*3/uL (ref 0.0–0.2)
Basos: 1 %
EOS (ABSOLUTE): 0.1 10*3/uL (ref 0.0–0.4)
Eos: 1 %
Hematocrit: 41.4 % (ref 37.5–51.0)
Hemoglobin: 14.2 g/dL (ref 13.0–17.7)
Immature Grans (Abs): 0.1 10*3/uL (ref 0.0–0.1)
Immature Granulocytes: 1 %
Lymphocytes Absolute: 2.9 10*3/uL (ref 0.7–3.1)
Lymphs: 35 %
MCH: 30.5 pg (ref 26.6–33.0)
MCHC: 34.3 g/dL (ref 31.5–35.7)
MCV: 89 fL (ref 79–97)
Monocytes Absolute: 0.6 10*3/uL (ref 0.1–0.9)
Monocytes: 7 %
Neutrophils Absolute: 4.7 10*3/uL (ref 1.4–7.0)
Neutrophils: 55 %
Platelets: 227 10*3/uL (ref 150–450)
RBC: 4.66 x10E6/uL (ref 4.14–5.80)
RDW: 12.5 % (ref 11.6–15.4)
WBC: 8.3 10*3/uL (ref 3.4–10.8)

## 2023-05-30 LAB — TSH: TSH: 1.5 u[IU]/mL (ref 0.450–4.500)

## 2023-05-30 LAB — HGB A1C W/O EAG: Hgb A1c MFr Bld: 5.7 % — ABNORMAL HIGH (ref 4.8–5.6)

## 2023-05-30 LAB — RPR: RPR Ser Ql: NONREACTIVE

## 2023-05-30 LAB — C-REACTIVE PROTEIN: CRP: 1 mg/L (ref 0–10)

## 2023-05-30 LAB — SEDIMENTATION RATE: Sed Rate: 27 mm/h (ref 0–30)

## 2023-06-01 ENCOUNTER — Telehealth: Payer: Self-pay | Admitting: Neurology

## 2023-06-01 NOTE — Telephone Encounter (Signed)
[employer] surest no auth required sent to GI due to his weight. [phone]

## 2023-06-08 ENCOUNTER — Encounter: Payer: Self-pay | Admitting: Neurology

## 2023-06-12 ENCOUNTER — Other Ambulatory Visit: Payer: Self-pay | Admitting: Neurology

## 2023-06-12 DIAGNOSIS — G8929 Other chronic pain: Secondary | ICD-10-CM

## 2023-06-12 DIAGNOSIS — Z9889 Other specified postprocedural states: Secondary | ICD-10-CM

## 2023-06-13 NOTE — Telephone Encounter (Signed)
 [employer] Berkley Harvey: [id number] exp. 07/28/23

## 2023-06-20 ENCOUNTER — Other Ambulatory Visit: Payer: PRIVATE HEALTH INSURANCE

## 2023-06-22 ENCOUNTER — Ambulatory Visit
Admission: RE | Admit: 2023-06-22 | Discharge: 2023-06-22 | Disposition: A | Discharge status: Home / Self Care | Payer: PRIVATE HEALTH INSURANCE | Source: Ambulatory Visit | Attending: Neurology | Admitting: Neurology

## 2023-06-22 DIAGNOSIS — Z9889 Other specified postprocedural states: Secondary | ICD-10-CM

## 2023-06-22 DIAGNOSIS — G8929 Other chronic pain: Secondary | ICD-10-CM | POA: Diagnosis not present

## 2023-06-22 DIAGNOSIS — R519 Headache, unspecified: Secondary | ICD-10-CM | POA: Diagnosis not present

## 2023-06-28 ENCOUNTER — Telehealth: Payer: Self-pay | Admitting: Neurology

## 2023-06-28 ENCOUNTER — Encounter: Payer: Self-pay | Admitting: Neurology

## 2023-06-28 DIAGNOSIS — E348 Other specified endocrine disorders: Secondary | ICD-10-CM

## 2023-06-28 NOTE — Telephone Encounter (Signed)
 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  I have ordered repeat MRI of brain w/wo to be done in March 2026 to establish stability.  Orders Placed This Encounter  Procedures   MR BRAIN W WO CONTRAST      I have also send above message through his MyChart

## 2023-06-28 NOTE — Telephone Encounter (Signed)
 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   I have ordered repeat MRI of brain w/wo to be done in March 2026 to establish stability.     Pt voiced gratitude and understanding of all discussed
